# Patient Record
Sex: Female | Born: 1980 | Race: Asian | Hispanic: No | Marital: Single | Smoking: Former smoker
Health system: Southern US, Community
[De-identification: ages and names within clinical notes are randomized; demographics above are authoritative.]

## PROBLEM LIST (undated history)

## (undated) ENCOUNTER — Encounter

## (undated) HISTORY — PX: MOUTH SURGERY: SHX715

---

## 2016-10-01 ENCOUNTER — Ambulatory Visit: Admitting: Internal Medicine

## 2016-12-20 ENCOUNTER — Ambulatory Visit: Admitting: Adult Health

## 2016-12-23 LAB — HX THROAT CULT GRP A: CASE NUMBER: 2018028000945

## 2017-08-30 ENCOUNTER — Emergency Department (HOSPITAL_COMMUNITY)
Admission: EM | Admit: 2017-08-30 | Discharge: 2017-08-30 | Disposition: A | Discharge status: Home / Self Care | Payer: Self-pay | Attending: Emergency Medicine | Admitting: Emergency Medicine

## 2017-08-30 ENCOUNTER — Encounter (HOSPITAL_COMMUNITY): Payer: Self-pay | Admitting: Emergency Medicine

## 2017-08-30 DIAGNOSIS — Z87891 Personal history of nicotine dependence: Secondary | ICD-10-CM | POA: Insufficient documentation

## 2017-08-30 DIAGNOSIS — R110 Nausea: Secondary | ICD-10-CM | POA: Insufficient documentation

## 2017-08-30 DIAGNOSIS — Z79899 Other long term (current) drug therapy: Secondary | ICD-10-CM | POA: Insufficient documentation

## 2017-08-30 DIAGNOSIS — G43009 Migraine without aura, not intractable, without status migrainosus: Secondary | ICD-10-CM | POA: Insufficient documentation

## 2017-08-30 DIAGNOSIS — R42 Dizziness and giddiness: Secondary | ICD-10-CM | POA: Insufficient documentation

## 2017-08-30 LAB — CBC
HEMATOCRIT: 42.3 % (ref 36.0–46.0)
HEMOGLOBIN: 14 g/dL (ref 12.0–15.0)
MCH: 27.8 pg (ref 26.0–34.0)
MCHC: 33.1 g/dL (ref 30.0–36.0)
MCV: 83.9 fL (ref 78.0–100.0)
Platelets: 320 10*3/uL (ref 150–400)
RBC: 5.04 MIL/uL (ref 3.87–5.11)
RDW: 12.9 % (ref 11.5–15.5)
WBC: 9.3 10*3/uL (ref 4.0–10.5)

## 2017-08-30 LAB — I-STAT TROPONIN, ED: TROPONIN I, POC: 0.01 ng/mL (ref 0.00–0.08)

## 2017-08-30 LAB — BASIC METABOLIC PANEL
ANION GAP: 13 (ref 5–15)
BUN: 12 mg/dL (ref 6–20)
CALCIUM: 9.1 mg/dL (ref 8.9–10.3)
CO2: 21 mmol/L — ABNORMAL LOW (ref 22–32)
Chloride: 102 mmol/L (ref 101–111)
Creatinine, Ser: 0.74 mg/dL (ref 0.44–1.00)
GFR calc non Af Amer: >60 mL/min (ref >60)
Glucose, Bld: 104 mg/dL — ABNORMAL HIGH (ref 65–99)
POTASSIUM: 3.3 mmol/L — AB (ref 3.5–5.1)
SODIUM: 136 mmol/L (ref 135–145)

## 2017-08-30 LAB — I-STAT BETA HCG BLOOD, ED (MC, WL, AP ONLY): I-stat hCG, quantitative: <5 m[IU]/mL (ref <5)

## 2017-08-30 LAB — CBG MONITORING, ED: GLUCOSE-CAPILLARY: 116 mg/dL — AB (ref 65–99)

## 2017-08-30 MED ORDER — KETOROLAC TROMETHAMINE 30 MG/ML IJ SOLN
30.0000 mg | Freq: Once | INTRAMUSCULAR | Status: AC
  Administered 2017-08-30: 30 mg via INTRAVENOUS
  Filled 2017-08-30: qty 1

## 2017-08-30 MED ORDER — METOCLOPRAMIDE HCL 5 MG/ML IJ SOLN
10.0000 mg | Freq: Once | INTRAMUSCULAR | Status: AC
  Administered 2017-08-30: 10 mg via INTRAVENOUS
  Filled 2017-08-30: qty 2

## 2017-08-30 MED ORDER — IBUPROFEN 200 MG PO TABS
ORAL_TABLET | ORAL | Status: AC
  Filled 2017-08-30: qty 3

## 2017-08-30 MED ORDER — SODIUM CHLORIDE 0.9 % IV BOLUS (SEPSIS)
1000.0000 mL | Freq: Once | INTRAVENOUS | Status: AC
  Administered 2017-08-30: 1000 mL via INTRAVENOUS

## 2017-08-30 MED ORDER — METOCLOPRAMIDE HCL 10 MG PO TABS: 10.0000 mg | ORAL_TABLET | Freq: Four times a day (QID) | ORAL | 0 refills | Status: AC

## 2017-08-30 MED ORDER — DEXAMETHASONE SODIUM PHOSPHATE 10 MG/ML IJ SOLN
10.0000 mg | Freq: Once | INTRAMUSCULAR | Status: AC
  Administered 2017-08-30: 10 mg via INTRAVENOUS
  Filled 2017-08-30: qty 1

## 2017-08-30 MED ORDER — IBUPROFEN 800 MG PO TABS: 800.0000 mg | ORAL_TABLET | Freq: Three times a day (TID) | ORAL | 0 refills | Status: AC | PRN

## 2017-08-30 NOTE — Discharge Instructions (Signed)

## 2017-08-30 NOTE — ED Notes (Signed)
Pt verbalized understanding of d/c instructions and has no further questions. Pt is stable, A&Ox4, VSS.  

## 2017-08-30 NOTE — ED Notes (Signed)
Pt to NF requesting update, informed how many ppl ahead of her. Pt states "her heart is racing" Informed pt that we would recheck her VS.

## 2017-08-30 NOTE — ED Triage Notes (Signed)
Pt arrives via POv from home with HA, nausea, chills, dizziness. Pt attributes to stress. Pt alert oriented. Points to face/sinuses as area of pressure/pain. Denies cough, some runny nose. Pt ambulatory, VSS, NAD at present.

## 2017-08-30 NOTE — ED Provider Notes (Signed)
Emergency Department Provider Note   I have reviewed the triage vital signs and the nursing notes.   HISTORY  Chief Complaint Headache   HPI Kimberly Dickerson is a 36 y.o. female with PMH of migraine HA presents to the emergency department for evaluation of continued headache, nausea, chills, lightheadedness. Patient has had some associated face pain/pressure which she attributed to sinus infection. She has a history of both sinus infection and migraine headache. She states that her headache is gradually worsening and located across her forehead. She denies any fevers or neck pain/stiffness. No head trauma. No vision changes. No unilateral weakness or numbness. Today while showering she felt very lightheaded which in conjunction with the headache prompted her ED visit. No vaginal bleeding or discharge. No unilateral leg swelling or tenderness. She reports occasional heart palpitations but denies chest pain or difficulty breathing.    History reviewed. No pertinent past medical history.  There are no active problems to display for this patient.   History reviewed. No pertinent surgical history.  Current Outpatient Rx  . Order #: [id number] Class: Historical Med  . Order #: [id number] Class: Print  . Order #: [id number] Class: Print    Allergies Penicillins  History reviewed. No pertinent family history.  Social History Social History  Substance Use Topics  . Smoking status: Former Smoker    Types: Cigarettes    Quit date: 11/23/2006  . Smokeless tobacco: Not on file  . Alcohol use Yes     Comment: 4shots/days    Review of Systems  Constitutional: No fever/chills. Positive lightheadedness.  Eyes: No visual changes. ENT: No sore throat. Cardiovascular: Denies chest pain. Occasional palpitations.  Respiratory: Denies shortness of breath. Gastrointestinal: No abdominal pain.  No nausea, no vomiting.  No diarrhea.  No constipation. Genitourinary: Negative for  dysuria. Musculoskeletal: Negative for back pain. Skin: Negative for rash. Neurological: Negative for focal weakness or numbness. Positive HA and face pain.   10-point ROS otherwise negative.  ____________________________________________   PHYSICAL EXAM:  VITAL SIGNS: ED Triage Vitals  Enc Vitals Group     BP 08/30/17 1648 (!) 155/97     Pulse Rate 08/30/17 1648 88     Resp 08/30/17 1648 17     Temp 08/30/17 1648 97.8 F (36.6 C)     Temp Source 08/30/17 1648 Oral     SpO2 08/30/17 1648 97 %     Pain Score 08/30/17 2045 7    Constitutional: Alert and oriented. Well appearing and in no acute distress. Mild tenderness to palpation over the maxillary sinuses.  Eyes: Conjunctivae are normal. PERRL. EOMI.  Head: Atraumatic. Nose: No congestion/rhinnorhea. Mouth/Throat: Mucous membranes are moist. Neck: No stridor.   Cardiovascular: Normal rate, regular rhythm. Good peripheral circulation. Grossly normal heart sounds.   Respiratory: Normal respiratory effort.  No retractions. Lungs CTAB. Gastrointestinal: Soft and nontender. No distention.  Musculoskeletal: No lower extremity tenderness nor edema. No gross deformities of extremities. Neurologic:  Normal speech and language. No gross focal neurologic deficits are appreciated.  Skin:  Skin is warm, dry and intact. No rash noted.  ____________________________________________   LABS (all labs ordered are listed, but only abnormal results are displayed)  Labs Reviewed  BASIC METABOLIC PANEL - Abnormal; Notable for the following:       Result Value   Potassium 3.3 (*)    CO2 21 (*)    Glucose, Bld 104 (*)    All other components within normal limits  CBG MONITORING, ED - Abnormal; Notable for  the following:    Glucose-Capillary 116 (*)    All other components within normal limits  CBC  I-STAT BETA HCG BLOOD, ED ([facility], [facility], [facility] ONLY)  I-STAT TROPONIN, ED   ____________________________________________  EKG   EKG  Interpretation  Date/Time:  Monday August 30 2017 15:18:07 EDT Ventricular Rate:  101 PR Interval:  152 QRS Duration: 72 QT Interval:  332 QTC Calculation: 430 R Axis:   55 Text Interpretation:  Sinus tachycardia Abnormal ECG No STEMI.  Confirmed by Alona Bene [phone]) on 08/30/2017 9:22:44 PM       ____________________________________________  RADIOLOGY  None  ____________________________________________   PROCEDURES  Procedure(s) performed:   Procedures  None ____________________________________________   INITIAL IMPRESSION / ASSESSMENT AND PLAN / ED COURSE  Pertinent labs & imaging results that were available during my care of the patient were reviewed by me and considered in my medical decision making (see chart for details).  Patient presents to the emergency department for evaluation of headache with associated nausea, chills, dizziness. She is well appearing and as a normal neurological exam. She has some mild maxillary sinus tenderness to palpation. No sudden onset maximal intensity symptoms to suggest subarachnoid hemorrhage or other intracranial bleed. No head trauma. No evidence of underlying infection other than possible sinusitis. No CP or Dyspnea to suggest cardiogenic or vascular cause of lightheadeness. Normal cranial nerve exam. No CT imaging at this time. Plan for treatment of symptoms with IV fluids, Decadron, Toradol, Reglan, reassessment.  Patient's HA has resolved. Plan for PCP follow up and also provided contact info for Neurology.  At this time, I do not feel there is any life-threatening condition present. I have reviewed and discussed all results (EKG, imaging, lab, urine as appropriate), exam findings with patient. I have reviewed nursing notes and appropriate previous records.  I feel the patient is safe to be discharged home without further emergent workup. Discussed usual and customary return precautions. Patient and family (if present)  verbalize understanding and are comfortable with this plan.  Patient will follow-up with their primary care provider. If they do not have a primary care provider, information for follow-up has been provided to them. All questions have been answered.  ____________________________________________  FINAL CLINICAL IMPRESSION(S) / ED DIAGNOSES  Final diagnoses:  Migraine without aura and without status migrainosus, not intractable  Lightheadedness     MEDICATIONS GIVEN DURING THIS VISIT:  Medications  ibuprofen (ADVIL,MOTRIN) 200 MG tablet (not administered)  sodium chloride 0.9 % bolus 1,000 mL (0 mLs Intravenous Stopped 08/30/17 2256)  ketorolac (TORADOL) 30 MG/ML injection 30 mg (30 mg Intravenous Given 08/30/17 2136)  metoCLOPramide (REGLAN) injection 10 mg (10 mg Intravenous Given 08/30/17 2136)  dexamethasone (DECADRON) injection 10 mg (10 mg Intravenous Given 08/30/17 2136)     NEW OUTPATIENT MEDICATIONS STARTED DURING THIS VISIT:  Discharge Medication List as of 08/30/2017 10:42 PM    START taking these medications   Details  ibuprofen (ADVIL,MOTRIN) 800 MG tablet Take 1 tablet (800 mg total) by mouth every 8 (eight) hours as needed., Starting Mon 08/30/2017, Print    metoCLOPramide (REGLAN) 10 MG tablet Take 1 tablet (10 mg total) by mouth every 6 (six) hours., Starting Mon 08/30/2017, Print        Note:  This document was prepared using Dragon voice recognition software and may include unintentional dictation errors.  Alona Bene, MD Emergency Medicine    Long, Arlyss Repress, MD 08/30/17 [phone]

## 2017-08-31 ENCOUNTER — Encounter (HOSPITAL_COMMUNITY): Payer: Self-pay

## 2017-08-31 ENCOUNTER — Emergency Department (HOSPITAL_COMMUNITY)
Admission: EM | Admit: 2017-08-31 | Discharge: 2017-08-31 | Disposition: A | Discharge status: Home / Self Care | Payer: Self-pay | Attending: Emergency Medicine | Admitting: Emergency Medicine

## 2017-08-31 ENCOUNTER — Emergency Department (HOSPITAL_COMMUNITY): Payer: Self-pay

## 2017-08-31 DIAGNOSIS — Z87891 Personal history of nicotine dependence: Secondary | ICD-10-CM | POA: Insufficient documentation

## 2017-08-31 DIAGNOSIS — R51 Headache: Secondary | ICD-10-CM | POA: Insufficient documentation

## 2017-08-31 DIAGNOSIS — R519 Headache, unspecified: Secondary | ICD-10-CM

## 2017-08-31 DIAGNOSIS — R030 Elevated blood-pressure reading, without diagnosis of hypertension: Secondary | ICD-10-CM | POA: Insufficient documentation

## 2017-08-31 DIAGNOSIS — Z79899 Other long term (current) drug therapy: Secondary | ICD-10-CM | POA: Insufficient documentation

## 2017-08-31 LAB — I-STAT CHEM 8, ED
BUN: 11 mg/dL (ref 6–20)
CHLORIDE: 102 mmol/L (ref 101–111)
Calcium, Ion: 1.15 mmol/L (ref 1.15–1.40)
Creatinine, Ser: 0.5 mg/dL (ref 0.44–1.00)
Glucose, Bld: 128 mg/dL — ABNORMAL HIGH (ref 65–99)
HEMATOCRIT: 48 % — AB (ref 36.0–46.0)
HEMOGLOBIN: 16.3 g/dL — AB (ref 12.0–15.0)
POTASSIUM: 3.8 mmol/L (ref 3.5–5.1)
Sodium: 138 mmol/L (ref 135–145)
TCO2: 24 mmol/L (ref 22–32)

## 2017-08-31 MED ORDER — IBUPROFEN 400 MG PO TABS
ORAL_TABLET | ORAL | Status: AC
  Administered 2017-08-31: 400 mg
  Filled 2017-08-31: qty 1

## 2017-08-31 MED ORDER — IBUPROFEN 400 MG PO TABS: 400.0000 mg | ORAL_TABLET | Freq: Once | ORAL | Status: DC | PRN

## 2017-08-31 MED ORDER — METOCLOPRAMIDE HCL 5 MG/ML IJ SOLN
10.0000 mg | Freq: Once | INTRAMUSCULAR | Status: AC
  Administered 2017-08-31: 10 mg via INTRAVENOUS
  Filled 2017-08-31: qty 2

## 2017-08-31 MED ORDER — SODIUM CHLORIDE 0.9 % IV BOLUS (SEPSIS)
1000.0000 mL | Freq: Once | INTRAVENOUS | Status: AC
  Administered 2017-08-31: 1000 mL via INTRAVENOUS

## 2017-08-31 MED ORDER — DIPHENHYDRAMINE HCL 50 MG/ML IJ SOLN
25.0000 mg | Freq: Once | INTRAMUSCULAR | Status: AC
  Administered 2017-08-31: 25 mg via INTRAVENOUS
  Filled 2017-08-31: qty 1

## 2017-08-31 NOTE — ED Triage Notes (Signed)
Per Pt, Pt is coming from home with complaints of headache that has continued for one week. Pt was seen here yesterday and given IV MIgraine Cocktail. Pt reports headache is worse today and while she was taking a shower she had bilateral "pins and needles" in her hands and legs. Denies any N/V, but reports some photosensitivity. Neuro exam WNL.

## 2017-08-31 NOTE — ED Provider Notes (Signed)
[facility]-EMERGENCY DEPT Provider Note   CSN: [id number] Arrival date & time: 08/31/17  0830     History   Chief Complaint Chief Complaint  Patient presents with  . Headache    HPI Tonye Tancredi is a 36 y.o. female.  Mariella Blackwelder is a 36 y.o. Female who presents the emergency department complaining of a headache ongoing for the past week. Patient reports gradual onset of headache last week that has gradually worsened and has been persistent. She reports generalized headache as well as some lightheadedness with position change. She reports she has a history of Crohn's disease and has had worsening diarrhea since her headache began. She reports 3 loose stools yesterday and one loose stool today. No hematochezia. She denies any abdominal pain, nausea or vomiting. She was seen in the emergency department yesterday for her headache.She reports the medicine she was given helped her headache for about an hour and a half and then her symptoms have returned. She returned to the emergency department this morning because while in the shower today she felt tingling in her bilateral hands and legs. This has since resolved. She has taken nothing for treatment of her symptoms today. She denies fevers, neck stiffness, changes to her vision, abdominal pain, nausea, vomiting, hematochezia, urinary symptoms, rashes, numbness or weakness.   The history is provided by the patient and medical records. No language interpreter was used.  Headache   Pertinent negatives include no fever, no shortness of breath, no nausea and no vomiting.    History reviewed. No pertinent past medical history.  There are no active problems to display for this patient.   Past Surgical History:  Procedure Laterality Date  . MOUTH SURGERY      OB History    No data available       Home Medications    Prior to Admission medications   Medication Sig Start Date End Date Taking? Authorizing Provider  acetaminophen (TYLENOL)  325 MG tablet Take 650 mg by mouth every 6 (six) hours as needed for mild pain.   Yes [provider]  ibuprofen (ADVIL,MOTRIN) 800 MG tablet Take 1 tablet (800 mg total) by mouth every 8 (eight) hours as needed. Patient taking differently: Take 800 mg by mouth every 8 (eight) hours as needed for moderate pain.  08/30/17  Yes Long, Arlyss Repress, MD  norethindrone-ethinyl estradiol 1/35 (ORTHO-NOVUM, NORTREL,CYCLAFEM) tablet Take 1 tablet by mouth daily.   Yes [provider]  metoCLOPramide (REGLAN) 10 MG tablet Take 1 tablet (10 mg total) by mouth every 6 (six) hours. Patient not taking: Reported on 08/31/2017 08/30/17   Long, Arlyss Repress, MD    Family History No family history on file.  Social History Social History  Substance Use Topics  . Smoking status: Former Smoker    Types: Cigarettes    Quit date: 11/23/2006  . Smokeless tobacco: Not on file  . Alcohol use Yes     Comment: 4shots/days     Allergies   Penicillins   Review of Systems Review of Systems  Constitutional: Negative for chills and fever.  HENT: Negative for congestion and sore throat.   Eyes: Positive for photophobia. Negative for pain and visual disturbance.  Respiratory: Negative for cough and shortness of breath.   Cardiovascular: Negative for chest pain.  Gastrointestinal: Positive for diarrhea. Negative for abdominal pain, blood in stool, nausea and vomiting.  Genitourinary: Negative for dysuria.  Musculoskeletal: Negative for back pain and neck stiffness.  Skin: Negative for rash.  Neurological: Positive for light-headedness and headaches. Negative for dizziness, syncope, weakness and numbness.     Physical Exam Updated Vital Signs BP (!) 146/108 (BP Location: Left Arm)   Pulse 89   Temp 98.4 F (36.9 C)   Resp 16   Ht  (1.549 m)   Wt 63.5 kg (140 lb)   LMP 08/24/2017   SpO2 99%   BMI 26.45 kg/m   Physical Exam  Constitutional: She is oriented to person, place, and time. She  appears well-developed and well-nourished. No distress.  Nontoxic appearing.  HENT:  Head: Normocephalic and atraumatic.  Right Ear: External ear normal.  Left Ear: External ear normal.  Mouth/Throat: Oropharynx is clear and moist.  Eyes: Pupils are equal, round, and reactive to light. Conjunctivae and EOM are normal. Right eye exhibits no discharge. Left eye exhibits no discharge.  Neck: Normal range of motion. Neck supple. No JVD present. No tracheal deviation present.  No meningeal signs.  Cardiovascular: Normal rate, regular rhythm, normal heart sounds and intact distal pulses.  Exam reveals no gallop and no friction rub.   No murmur heard. Pulmonary/Chest: Effort normal and breath sounds normal. No stridor. No respiratory distress. She has no wheezes. She has no rales.  Lungs are clear to ascultation bilaterally. Symmetric chest expansion bilaterally. No increased work of breathing. No rales or rhonchi.    Abdominal: Soft. Bowel sounds are normal. She exhibits no distension and no mass. There is no tenderness. There is no guarding.  Abdomen is soft and nontender to palpation.  Musculoskeletal: Normal range of motion. She exhibits no edema, tenderness or deformity.  Patient is spontaneously moving all extremities in a coordinated fashion exhibiting good strength.   Lymphadenopathy:    She has no cervical adenopathy.  Neurological: She is alert and oriented to person, place, and time. No cranial nerve deficit or sensory deficit. She exhibits normal muscle tone. Coordination normal.  Patient is alert and oriented 3. Speech is clear and coherent. Cranial nerves are intact. Sensation and strength is intact to bilateral upper and lower extremities. Normal gait. No pronator drift. Finger to nose intact bilaterally. EOMs are intact. Vision is grossly intact.  Skin: Skin is warm and dry. Capillary refill takes less than 2 seconds. No rash noted. She is not diaphoretic. No erythema. No pallor.    Psychiatric: She has a normal mood and affect. Her behavior is normal.  Nursing note and vitals reviewed.    ED Treatments / Results  Labs (all labs ordered are listed, but only abnormal results are displayed) Labs Reviewed  I-STAT CHEM 8, ED - Abnormal; Notable for the following:       Result Value   Glucose, Bld 128 (*)    Hemoglobin 16.3 (*)    HCT 48.0 (*)    All other components within normal limits    EKG  EKG Interpretation None       Radiology Ct Head Wo Contrast  Result Date: 08/31/2017 CLINICAL DATA:  36 year old female with acute headache for 1 week. Treated with IV migraine cocktail yesterday but progressive symptoms today. Bilateral extremity paresthesias. EXAM: CT HEAD WITHOUT CONTRAST TECHNIQUE: Contiguous axial images were obtained from the base of the skull through the vertex without intravenous contrast. COMPARISON:  None. FINDINGS: Brain: Cerebral volume is within normal limits. No midline shift, ventriculomegaly, mass effect, evidence of mass lesion, intracranial hemorrhage or evidence of cortically based acute infarction. Gray-white matter differentiation is within normal limits throughout the brain. Vascular: No suspicious  intracranial vascular hyperdensity. Skull: Negative.  No acute osseous abnormality identified. Sinuses/Orbits: Clear. Other: Visualized orbit soft tissues are within normal limits. Visualized scalp soft tissues are within normal limits. IMPRESSION: Normal noncontrast CT appearance of the brain. Electronically Signed   By: Odessa Fleming M.D.   On: 08/31/2017 12:25    Procedures Procedures (including critical care time)  Medications Ordered in ED Medications  ibuprofen (ADVIL,MOTRIN) tablet 400 mg (not administered)  ibuprofen (ADVIL,MOTRIN) 400 MG tablet (400 mg  Given 08/31/17 0853)  sodium chloride 0.9 % bolus 1,000 mL (1,000 mLs Intravenous New Bag/Given 08/31/17 1154)  metoCLOPramide (REGLAN) injection 10 mg (10 mg Intravenous Given 08/31/17  1154)  diphenhydrAMINE (BENADRYL) injection 25 mg (25 mg Intravenous Given 08/31/17 1154)     Initial Impression / Assessment and Plan / ED Course  I have reviewed the triage vital signs and the nursing notes.  Pertinent labs & imaging results that were available during my care of the patient were reviewed by me and considered in my medical decision making (see chart for details).    This is a 36 y.o. Female who presents the emergency department complaining of a headache ongoing for the past week. Patient reports gradual onset of headache last week that has gradually worsened and has been persistent. She reports generalized headache as well as some lightheadedness with position change. She reports she has a history of Crohn's disease and has had worsening diarrhea since her headache began. She reports 3 loose stools yesterday and one loose stool today. No hematochezia. She denies any abdominal pain, nausea or vomiting. She was seen in the emergency department yesterday for her headache.She reports the medicine she was given helped her headache for about an hour and a half and then her symptoms have returned. She returned to the emergency department this morning because while in the shower today she felt tingling in her bilateral hands and legs. This has since resolved. She has taken nothing for treatment of her symptoms today. She denies fevers, neck stiffness, changes to her vision, abdominal pain. On exam the patient is afebrile nontoxic appearing.  Presentation is like pts typical HA and non concerning for [facility], [facility], Meningitis, or temporal arteritis. Pt is afebrile with no focal neuro deficits, nuchal rigidity, or change in vision. Pt HA treated and resolved while in ED. She reports feeling much better at revaluation and no headache. CT scan is normal. She reports lots of stress recently. I discussed methods to help with her stress. I encouraged close follow up by PCP for recheck blood pressure. Pt is  to follow up with PCP to discuss prophylactic medication. Pt verbalizes understanding and is agreeable with plan to dc.  I advised the patient to follow-up with their primary care provider this week. I advised the patient to return to the emergency department with new or worsening symptoms or new concerns. The patient verbalized understanding and agreement with plan.    Final Clinical Impressions(s) / ED Diagnoses   Final diagnoses:  Bad headache  Elevated blood pressure reading    New Prescriptions New Prescriptions   No medications on file     Everlene Farrier, Cordelia Poche 08/31/17 1341    Tilden Fossa, MD 09/01/17 [phone]

## 2017-08-31 NOTE — ED Notes (Signed)
Got patient into a gown on the monitor patient is resting with call bell in reach 

## 2017-08-31 NOTE — ED Notes (Signed)
PA at bedside.

## 2017-08-31 NOTE — ED Notes (Signed)
Transported to ct scan 

## 2018-06-08 ENCOUNTER — Ambulatory Visit: Admitting: Cardiovascular Disease

## 2018-06-08 ENCOUNTER — Ambulatory Visit

## 2018-06-08 ENCOUNTER — Ambulatory Visit: Admitting: Emergency Medicine

## 2018-06-08 LAB — HX COMPREHENSIVE METABOLIC PANEL
CASE NUMBER: 2019198000778
HX ALBUMIN LVL: 3.2 g/dL (ref 3.2–5.0)
HX ALKALINE PHOSPHATASE: 46 U/L (ref 30.0–117.0)
HX ALT: 45 U/L (ref 6.0–55.0)
HX ANION GAP: 10 (ref 3.0–11.0)
HX AST: 52 U/L — ABNORMAL HIGH (ref 6.0–40.0)
HX BILIRUBIN TOTAL: 0.6 mg/dL (ref 0.2–1.2)
HX BUN: 11 mg/dL (ref 6.0–20.0)
HX CALCIUM LVL: 8.4 mg/dL — ABNORMAL LOW (ref 8.5–10.5)
HX CHLORIDE: 106 mmol/L (ref 98.0–110.0)
HX CO2: 23 mmol/L (ref 21.0–32.0)
HX CREATININE: 0.681 mg/dL (ref 0.55–1.3)
HX GLUCOSE LVL: 108 mg/dL (ref 70.0–110.0)
HX POTASSIUM LVL: 3.4 mmol/L — ABNORMAL LOW (ref 3.6–5.2)
HX SODIUM LVL: 139 mmol/L (ref 136.0–146.0)
HX TOTAL PROTEIN: 7.8 g/dL (ref 6.0–8.4)

## 2018-06-08 LAB — HX .AUTOMATED DIFF
CASE NUMBER: 2019198000778
HX ABSOLUTE BASO COUNT: 0.11 10*3/uL (ref 0.0–0.22)
HX ABSOLUTE EOS COUNT: 0.26 10*3/uL (ref 0.0–0.45)
HX ABSOLUTE LYMPHS COUNT: 2.62 10*3/uL (ref 0.74–5.04)
HX ABSOLUTE MONO COUNT: 0.61 10*3/uL (ref 0.0–1.34)
HX ABSOLUTE NEUTRO COUNT: 4.29 10*3/uL (ref 1.48–7.95)
HX BASOPHILS: 1.4 %
HX EOSINOPHILS: 3.3 %
HX IMMATURE GRANULOCYTES: 0.1 % (ref 0.0–2.0)
HX LYMPHOCYTES: 33.2 %
HX MONOCYTES: 7.7 %
HX NEUTROPHILS: 54.3 %

## 2018-06-08 LAB — HX TSH
CASE NUMBER: 18239982
HX 3RD GEN TSH: 1.45 u[IU]/mL (ref 0.358–3.74)

## 2018-06-08 LAB — HX DRUGS OF ABUSE URINE 5
CASE NUMBER: 2019198000779
HX U AMPHETAMINES SCRN: NEGATIVE
HX U BENZODIAZEPINE SCRN: NEGATIVE
HX U COCAINE SCRN: NEGATIVE
HX U ETHANOL INTERP: NEGATIVE
HX U ETHANOL: 42 mg/dL (ref 0.0–49.0)
HX U OPIATE 300 SCRN: NEGATIVE
HX U PH FOR DAU: 6 (ref 4.5–8.0)

## 2018-06-08 LAB — HX GLOMERULAR FILTRATION RATE (ESTIMATED)
CASE NUMBER: 2019198000778
HX AFN AMER GLOMERULAR FILTRATION RATE: >90
HX NON-AFN AMER GLOMERULAR FILTRATION RATE: >90

## 2018-06-08 LAB — HX CBC W/ DIFF
CASE NUMBER: 2019198000778
HX ABSOLUTE NRBC COUNT: 0 10*3/uL
HX HCT: 43.7 % (ref 36.0–47.0)
HX HGB: 14.2 g/dL (ref 11.8–16.0)
HX MCH: 27.3 pg (ref 26.0–34.0)
HX MCHC: 32.5 g/dL (ref 31.0–37.0)
HX MCV: 83.9 fL (ref 80.0–100.0)
HX MPV: 9.4 fL (ref 9.4–12.4)
HX NRBC PERCENT: 0 %
HX PLATELET: 301 10*3/uL (ref 150.0–400.0)
HX RBC: 5.21 10*6/uL — ABNORMAL HIGH (ref 3.9–5.2)
HX RDW-CV: 13.6 % (ref 11.5–14.5)
HX RDW-SD: 41.9 fL (ref 35.0–51.0)
HX WBC: 7.9 10*3/uL (ref 3.7–11.2)

## 2018-06-08 LAB — HX URINE DIPSTICK W/REFLEX
CASE NUMBER: 2019198000779
HX UA BILIRUBIN: NEGATIVE
HX UA GLUCOSE: NEGATIVE
HX UA KETONES: 5 mg/dL — AB
HX UA LEUKOCYTE ESTERASE: NEGATIVE
HX UA NITRITE: NEGATIVE
HX UA PH: 7 (ref 5.0–8.0)
HX UA PROTEIN: NEGATIVE
HX UA RBC: 1 /HPF (ref 0.0–2.0)
HX UA SPECIFIC GRAVITY: 1.004 (ref 1.003–1.03)
HX UA SQUAMOUS EPITHELIAL: <1 (ref 0.0–5.0)
HX UA UROBILINOGEN: NEGATIVE
HX UA WBC: <1 (ref 0.0–5.0)

## 2018-06-08 LAB — HX MAGNESIUM LEVEL
CASE NUMBER: 2019198000778
HX MAGNESIUM LVL: 1.9 mg/dL (ref 1.7–2.5)

## 2018-06-08 LAB — HX ETHANOL LEVEL
CASE NUMBER: 2019198000778
HX ETHANOL LVL: 43 mg/dL — ABNORMAL HIGH (ref 0.0–3.0)

## 2018-06-08 LAB — HX D-DIMER QUANTITATIVE
CASE NUMBER: 18239982
HX D-DIMER QUANT: 0.25 mg{FEU}/L

## 2018-06-08 LAB — HX TROPONIN I
CASE NUMBER: 2019198000778
HX TROPONIN I: <0.015 (ref 0.015–0.045)

## 2018-06-08 LAB — HX PT
CASE NUMBER: 2019198000778
HX INR: 1
HX PT: 10.6 s (ref 9.3–11.6)

## 2018-06-08 NOTE — H&P (Signed)
Chief Complaint    Palpitations    History of Present Illness    37 year old female with history of        Inflammatory bowel disease presumed to be Crohn's presently not on any  medications        Active alcohol abuse        Presented to the emergency department with complaints of palpitations.  Patient was apparently asymptomatic until today morning then all of a sudden  she experienced palpitations which she describes like a pounding sensation in  her chest associated with mild dizziness.  Patient denied any associated chest  pain, shortness of breath or syncopal event. Patient's husband checked her  pulse rate and found it to be irregular and fast so he drove her to the  emergency department for further evaluation.  Patient on arrival to the ER was  noted to be in rapid fibrillation for which she was started on diltiazem drip.  Patient denied any similar episodes in the past.  Patient admits alcohol  intake on a regular basis.      Review of Systems    Constitutional: No Fever, Chills, Sweats, Weakness, Fatigue, Decreased  Activity    Eye: No Recent visual problem, Icterus, Discharge, Blurring, Double vision,  Visual disturbances    Respiratory: No SOB, Cough, Sputum Production, Hemoptysis, Wheezing, Cyanosis,  Apnea    Cardiovascular: No Chest pain. Positive for  palpitations,dizziness    Gastrointestinal: No nausea, vomiting, diarrhea, constipation, heartburn,  abdominal pain, hematemesis    Genitourinary: No Dysuria, hematuria, change in urine stream, urethral  discharge, lesions    Hematology/Lymph: No Bruising tendency, bleeding tendency, swollen lymph  glands    Endocrine: No excessive thirst, polyuria, cold intolerance, heat intolerance,  excessive hunger    Immunologic: No Immunocompromise, recurrent fevers, recurrent infections,  malaise    Musculoskeletal: No Back pain, neck pain, joint pain, muscle pain, decreased  ROM, trauma    Integumentary: No Rash, pruritis, abrasions, breakdown, burns,  dryness,  petechiae, skin lesion, hypertrophic scar, keloid    Neurologic: No abnormal balance, confusion, numbness, tingling, headache    Psychiatric: No anxiety, mania, delusional, hallucinations    All other ROS:          Code Status    Full Code    Physical Exam    Vitals & Measurements    **T: **97.9 F  (Oral) **HR: **74 **RR: **23 **BP: **102/73 **SpO2: **98% **O2  Method (L/min): **Room air **WT: **70 Kg    General: Alert and oriented, no acute distress    Eye: PERRL, Normal Conjunctiva    HEENT: Normocephalic, no damage to dentition, moist oral mucosa, no pharyngeal  erythema, no sinus tenderness    Neck: supple, nontender, carotid pulse WNL, no carotid bruit, no JVD, no  lymphadenopathy, no thyromegaly, full ROM    Respiratory: Lungs clear to auscultation, respirations non-labored, breath  sounds equal and regular, symmetrical chest expansion, no chest wall  tenderness    Cardiovascular: Normal Rate, normal rhythm, _ beats/minute, no gallop, good  pulses equal in all extremities, normal peripheral perfusion, no edema.    Gastrointestinal: Abdomen soft, non tender, non-distended, normal bowel sounds  all four quadrants, no organomegaly    Neurologic: Alert, Oriented, normal sensory, normal motor, no focal deficits.  CN II-XII intact.      Impression and Plan    37 year old female with history of        Inflammatory bowel disease presumed to be Crohn's presently not on  any  medications        Active alcohol abuse        Presented to the emergency department with complaints of palpitations.  Patient was apparently asymptomatic until today morning then all of a sudden  she experienced palpitations which she describes like a pounding sensation in  her chest associated with mild dizziness.  Patient denied any associated chest  pain, shortness of breath or syncopal event. Patient's husband checked her  pulse rate and found it to be irregular and fast so he drove her to the  emergency department for further  evaluation.  Patient on arrival to the ER was  noted to be in rapid fibrillation for which she was started on diltiazem drip.  Patient denied any similar episodes in the past.  Patient admits alcohol  intake on a regular basis.        Rapid atrial fibrillation: Patient was placed on diltiazem drip to control her  heart rate.  Patient will also be started on beta-blockers and anticoagulation  with enoxaparin.  Echo is pending.  Patient will also be evaluated by  cardiology team and may need TEE cardioversion if she does not convert  spontaneously to sinus rhythm.        Alcohol abuse: Patient stated that she drinks 7 to 10 shots of tequila every  day which is also a trigger for atrial fibrillation. Patient was counseled in  detail to quit alcohol. Patient will be placed on phenobarbital protocol.        DVT prophylaxis: Patient is on  subcutaneous enoxaparin.        CODE STATUS patient is listed as a full code          CMS Two-Midnight Rule    I certify that hospital inpatient services are reasonable and medically  necessary. They are appropriately provided as inpatient services in accordance  with the two midnight benchmark under 42CFR 412 3(e), or the services are  specified as inpatient only procedure under 42 CFR 419 22(n).          Problem List/Past Medical History    Ongoing    Alcohol abuse    IBS - Irritable bowel syndrome    Irritable bowel syndrome    Migraines    Historical    No qualifying data    Procedure/Surgical History    Nerve repair procedure in mouth.    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    Colon: Negative: Mother, Father, Sister, Brother, Daughter, Son, Mat.  Emelia Loron, Mat. Grandmother, [place]. Grandfather and Dennie Bible. Grandmother.    Allergies    amoxicillin    penicillins    Medications    _Inpatient_    Dextrose 5% in Water 100 mL + Cardizem IV Additive 125  mg    folic acid, 1 mg= 1 tab(s), PO, Daily    Lovenox, 70 mg= 0.7 mL, 1 mg/Kg, sc, q12hr    metoclopramide, 10 mg= 2 mL, IV Push, q6hr, PRN    Metoprolol Tartrate (Lopressor), 25 mg= 1 tab(s), PO, TID    multivitamin with minerals, 1 tab(s), PO, Daily    ondansetron, 4 mg= 2 mL, IV Push, q8hr, PRN    pantoprazole, 40 mg= 1 tab(s), PO, Daily    PHENobarbital, 64.8 mg= 1 tab(s), PO, q8hr  PHENobarbital, 64.8 mg= 1 tab(s), PO, q12hr    PHENobarbital, 32.4 mg= 1 tab(s), PO, q12hr    PHENobarbital, 64.8 mg= 1 tab(s), PO, q6hr    PHENobarbital, 65 mg= 1 mL, IV Push, q8hr, PRN    thiamine, 100 mg= 1 tab(s), PO, Daily    Tylenol 325 mg oral tablet, 650 mg= 2 tab(s), PO, q4hr, PRN    _Home_    Alyacen 1/35 oral tablet, 1 tab(s), PO, Daily, TAKE 1 TABLET BY MOUTH EVERY  DAY    Diet    Regular Diet - Ordered    -- 06/08/18 11:23:00 EDT, Assist Room Service, Scheduled / PRN    Lab Results    Glucose Lvl: 108 mg/dL (08/65/78 46:96:29)    BUN: 11 mg/dL (52/84/13 24:40:10)    Creatinine: 0.681 mg/dL (27/25/36 64:40:34)    Afn Amer Glomerular Filtration Rate: >90 (06/08/18 09:09:00)    Non-Afn Amer Glomerular Filtration Rate: >90 (06/08/18 09:09:00)    Sodium Lvl: 139 mmol/L (06/08/18 09:09:00)    Potassium Lvl: 3.4 mmol/L Low (06/08/18 09:09:00)    Chloride: 106 mmol/L (06/08/18 09:09:00)    CO2: 23 mmol/L (06/08/18 09:09:00)    Anion Gap: 10 (06/08/18 09:09:00)    Total Protein: 7.8 Gm/dL (74/25/95 63:87:56)    Albumin Lvl: 3.2 Gm/dL (43/32/95 18:84:16)    Calcium Lvl: 8.4 mg/dL Low (60/63/01 60:10:93)    Magnesium Lvl: 1.9 mg/dL (23/55/73 22:02:54)    Bilirubin Total: 0.6 mg/dL (27/06/23 76:28:31)    Alkaline Phosphatase: 46 Units/L (06/08/18 09:09:00)    AST: 52 Units/L High (06/08/18 09:09:00)    ALT: 45 Units/L (06/08/18 09:09:00)    3rd Gen TSH: 1.45 mclU/ml (06/08/18 09:09:00)    Troponin I: <0.015 (06/08/18 09:09:00)    Ethanol Lvl: 43 mg/dL High (51/76/16 07:37:10)    U Amphetamines Scrn: Negative (06/08/18  09:04:00)    U Benzodiazepine Scrn: Negative (06/08/18 09:04:00)    U Cocaine Scrn: Negative (06/08/18 09:04:00)    U Ethanol: 42 mg/dL (62/69/48 54:62:70)    U Ethanol Interp: Negative (06/08/18 09:04:00)    U Opiate 300 Scrn: Negative (06/08/18 09:04:00)    Confirm Positive: Confirm Upon Request (06/08/18 09:04:00)    WBC: 7.9 thous/mm3 (06/08/18 09:09:00)    RBC: 5.21 Mil/mm3 High (06/08/18 09:09:00)    Hgb: 14.2 Gm/dL (35/00/93 81:82:99)    Hct: 43.7 % (06/08/18 09:09:00)    Platelet: 301 thous/mm3 (06/08/18 09:09:00)    MCV: 83.9 fL (06/08/18 09:09:00)    MCH: 27.3 pGm (06/08/18 09:09:00)    MCHC: 32.5 Gm/dL (37/16/96 78:93:81)    RDW-SD: 41.9 fL (06/08/18 09:09:00)    MPV: 9.4 fL (06/08/18 09:09:00)    Absolute Neutro Count: 4.29 thous/mm3 (06/08/18 09:09:00)    Absolute Lymphs Count: 2.62 thous/mm3 (06/08/18 09:09:00)    Absolute Mono Count: 0.61 thous/mm3 (06/08/18 09:09:00)    Absolute Eos Count: 0.26 thous/mm3 (06/08/18 09:09:00)    Absolute Baso Count: 0.11 thous/mm3 (06/08/18 09:09:00)    Neutrophils: 54.3 % (06/08/18 09:09:00)    Lymphocytes: 33.2 % (06/08/18 09:09:00)    Monocytes: 7.7 % (06/08/18 09:09:00)    Eosinophils: 3.3 % (06/08/18 09:09:00)    Basophils: 1.4 % (06/08/18 09:09:00)    Immature Granulocytes: 0.1 % (06/08/18 09:09:00)    NRBC Percent: 0 % (06/08/18 09:09:00)    Absolute NRBC Count: 0 thous/mm3 (06/08/18 09:09:00)    UA Color: Straw1 (06/08/18 09:04:00)    UA Clarity: Clear1 (06/08/18 09:04:00)    UA Specific Gravity: 1.004 (06/08/18 09:04:00)    UA  pH: 7 (06/08/18 09:04:00)    UA Protein: Negative1 (06/08/18 09:04:00)    UA Glucose: Negative1 (06/08/18 09:04:00)    UA Ketones: 5 Abnormal (06/08/18 09:04:00)    UA Bilirubin: Negative1 (06/08/18 09:04:00)    UA Blood: Moderate Abnormal (06/08/18 09:04:00)    UA Urobilinogen: Negative1 (06/08/18 09:04:00)    UA Nitrite: Negative1 (06/08/18 09:04:00)    UA Leukocyte Esterase: Negative1 (06/08/18 09:04:00)    UA RBC: 1 /HPF (06/08/18  09:04:00)    UA WBC: <1 (06/08/18 09:04:00)    UA Squamous Epithelial: <1 (06/08/18 09:04:00)    UA Bacteria: None1 (06/08/18 09:04:00)    UA Mucous: Few (06/08/18 09:04:00)    PT: 10.6 sec (06/08/18 09:09:00)    INR: 1 (06/08/18 09:09:00)    D-Dimer Quant: 0.25 mg/L FEU (06/08/18 09:09:00)    Diagnostic Results        ------        SIGNATURE LINE Electronically signed by Zane Herald MD-HOSP, Shantia Sanford on 06/08/2018 at  17:14:56 EST

## 2018-06-08 NOTE — Discharge Summary (Signed)
Date of Admission    06/08/2018    Date of Discharge    06/09/2018    Admission History    37 year old female with history of        Inflammatory bowel disease presumed to be Crohn's presently not on any  medications        Active alcohol abuse        Presented to the emergency department with complaints of palpitations.  Patient was apparently asymptomatic until today morning then all of a sudden  she experienced palpitations which she describes like a pounding sensation in  her chest associated with mild dizziness.  Patient denied any associated chest  pain, shortness of breath or syncopal event. Patient's husband checked her  pulse rate and found it to be irregular and fast so he drove her to the  emergency department for further evaluation.  Patient on arrival to the ER was  noted to be in rapid fibrillation for which she was started on diltiazem drip.  Patient denied any similar episodes in the past.  Patient admits alcohol  intake on a regular basis. [1]    Code Status    Code Status - Ordered    -- 06/08/18 16:56:00 EDT, Full Resuscitation, Constant Order    Allergies    amoxicillin    penicillins    Social History    _Alcohol_    Current, Liquor, Daily    _Substance Abuse_ \- Denies Substance Abuse    _Tobacco_ \- Denies Tobacco Use    Hospital Course    Procedures and Treatment Provided    Physical Exam    Vitals & Measurements    **T: **97.8 F  (Oral) **TMIN: **97.5 F  (Oral) **TMAX: **98.1 F  (Oral)  **HR: **60 **RR: **14 **BP: **103/72 **SpO2: **98% **O2 Method (L/min): **Room  air **WT: **78 Kg    General: Alert and oriented, no acute distress    Eye: PERRL, Normal Conjunctiva    HEENT: Normocephalic, no damage to dentition, moist oral mucosa, no pharyngeal  erythema, no sinus tenderness    Neck: supple, nontender, carotid pulse WNL, no carotid bruit, no JVD, no  lymphadenopathy, no thyromegaly, full ROM    Respiratory: Lungs clear to auscultation, respirations non-labored, breath  sounds  equal and regular, symmetrical chest expansion, no chest wall  tenderness    Cardiovascular: Normal Rate, normal rhythm, _ beats/minute, no gallop, good  pulses equal in all extremities, normal peripheral perfusion, no edema.    Gastrointestinal: Abdomen soft, non tender, non-distended, normal bowel sounds  all four quadrants, no organomegaly    Neurologic: Alert, Oriented, normal sensory, normal motor, no focal deficits.  CN II-XII intact.    [2]    Lab Results    Glucose Lvl: 108 mg/dL (32/44/01 02:72:53)    BUN: 11 mg/dL (66/44/03 47:42:59)    Creatinine: 0.681 mg/dL (56/38/75 64:33:29)    Afn Amer Glomerular Filtration Rate: >90 (06/08/18 09:09:00)    Non-Afn Amer Glomerular Filtration Rate: >90 (06/08/18 09:09:00)    Sodium Lvl: 139 mmol/L (06/08/18 09:09:00)    Potassium Lvl: 3.4 mmol/L Low (06/08/18 09:09:00)    Chloride: 106 mmol/L (06/08/18 09:09:00)    CO2: 23 mmol/L (06/08/18 09:09:00)    Anion Gap: 10 (06/08/18 09:09:00)    Total Protein: 7.8 Gm/dL (51/88/41 66:06:30)    Albumin Lvl: 3.2 Gm/dL (16/01/09 32:35:57)    Calcium Lvl: 8.4 mg/dL Low (32/20/25 42:70:62)    Magnesium Lvl: 1.9 mg/dL (37/62/83 15:17:61)    Bilirubin Total:  0.6 mg/dL (16/10/96 04:54:09)    Alkaline Phosphatase: 46 Units/L (06/08/18 09:09:00)    AST: 52 Units/L High (06/08/18 09:09:00)    ALT: 45 Units/L (06/08/18 09:09:00)    hCG Qualitative: Negative (06/09/18 02:27:00)    3rd Gen TSH: 1.45 mclU/ml (06/08/18 09:09:00)    Troponin I: <0.015 (06/08/18 09:09:00)    Ethanol Lvl: 43 mg/dL High (81/19/14 78:29:56)    U Amphetamines Scrn: Negative (06/08/18 09:04:00)    U Benzodiazepine Scrn: Negative (06/08/18 09:04:00)    U Cocaine Scrn: Negative (06/08/18 09:04:00)    U Ethanol: 42 mg/dL (21/30/86 57:84:69)    U Ethanol Interp: Negative (06/08/18 09:04:00)    U Opiate 300 Scrn: Negative (06/08/18 09:04:00)    Confirm Positive: Confirm Upon Request (06/08/18 09:04:00)    WBC: 7.9 thous/mm3 (06/08/18 09:09:00)    RBC: 5.21 Mil/mm3 High  (06/08/18 09:09:00)    Hgb: 14.2 Gm/dL (62/95/28 41:32:44)    Hct: 43.7 % (06/08/18 09:09:00)    Platelet: 301 thous/mm3 (06/08/18 09:09:00)    MCV: 83.9 fL (06/08/18 09:09:00)    MCH: 27.3 pGm (06/08/18 09:09:00)    MCHC: 32.5 Gm/dL (11/24/70 53:66:44)    RDW-SD: 41.9 fL (06/08/18 09:09:00)    MPV: 9.4 fL (06/08/18 09:09:00)    Absolute Neutro Count: 4.29 thous/mm3 (06/08/18 09:09:00)    Absolute Lymphs Count: 2.62 thous/mm3 (06/08/18 09:09:00)    Absolute Mono Count: 0.61 thous/mm3 (06/08/18 09:09:00)    Absolute Eos Count: 0.26 thous/mm3 (06/08/18 09:09:00)    Absolute Baso Count: 0.11 thous/mm3 (06/08/18 09:09:00)    Neutrophils: 54.3 % (06/08/18 09:09:00)    Lymphocytes: 33.2 % (06/08/18 09:09:00)    Monocytes: 7.7 % (06/08/18 09:09:00)    Eosinophils: 3.3 % (06/08/18 09:09:00)    Basophils: 1.4 % (06/08/18 09:09:00)    Immature Granulocytes: 0.1 % (06/08/18 09:09:00)    NRBC Percent: 0 % (06/08/18 09:09:00)    Absolute NRBC Count: 0 thous/mm3 (06/08/18 09:09:00)    UA Color: Straw1 (06/08/18 09:04:00)    UA Clarity: Clear1 (06/08/18 09:04:00)    UA Specific Gravity: 1.004 (06/08/18 09:04:00)    UA pH: 7 (06/08/18 09:04:00)    UA Protein: Negative1 (06/08/18 09:04:00)    UA Glucose: Negative1 (06/08/18 09:04:00)    UA Ketones: 5 Abnormal (06/08/18 09:04:00)    UA Bilirubin: Negative1 (06/08/18 09:04:00)    UA Blood: Moderate Abnormal (06/08/18 09:04:00)    UA Urobilinogen: Negative1 (06/08/18 09:04:00)    UA Nitrite: Negative1 (06/08/18 09:04:00)    UA Leukocyte Esterase: Negative1 (06/08/18 09:04:00)    UA RBC: 1 /HPF (06/08/18 09:04:00)    UA WBC: <1 (06/08/18 09:04:00)    UA Squamous Epithelial: <1 (06/08/18 09:04:00)    UA Bacteria: None1 (06/08/18 09:04:00)    UA Mucous: Few (06/08/18 09:04:00)    PT: 10.6 sec (06/08/18 09:09:00)    INR: 1 (06/08/18 09:09:00)    D-Dimer Quant: 0.25 mg/L FEU (06/08/18 09:09:00)    Discharge Diagnoses    1. Atrial fibrillation     Secondary to alcohol related atrial  fibrillation patient has been heavily  counseled to abstain from alcohol    And she converted to sinus rhythm at this point will continue with beta  blocker Toprol-XL 50 mg by mouth daily    And baby aspirin 81 mg by mouth daily with instruction to stop drinking  alcohol.    2. Alcohol abuse     She has been counseled and advised her to go to inpatient alcohol  rehabilitation program patient declined  However agreed to stay off the alcohol.    Discharge Medications    _Discharge_    folic acid 1 mg oral tablet, 1 mg= 1 tab(s), PO, Daily    Multiple Vitamins with Minerals oral tablet, 1 tab(s), PO, Daily    thiamine 100 mg oral tablet, 100 mg= 1 tab(s), PO, Daily    Toprol-XL 50 mg oral tablet, extended release, 50 mg= 1 tab(s), PO, Daily    Ecasa 81mg  po daily    Counseling    Stop drinking alcohol    [1] Admission H & Norvel Richards MD-HOSP, Anil 06/08/2018 15:39 EDT    [2] Admission H & Norvel Richards MD-HOSP, Willeen Cass 06/08/2018 15:39 EDT    SIGNATURE LINE Electronically signed by Irving Burton MD-HOSP, Claron Rosencrans on 06/09/2018  at 13:49:52 EST

## 2018-06-08 NOTE — Progress Notes (Signed)
Interim History    Doing well. Converted to SR. Feels well.    Physical Exam    Vitals & Measurements    **T: **97.7 F  (Oral) **HR: **81 **RR: **16 **BP: **126/90 **SpO2: **99%    **HT: **154.9 cm **WT: **78 Kg **BMI: **32.67    General: Alert and oriented, no acute distress    Eye: Normal Conjunctiva    HEENT: Normocephalic, moist oral mucosa    Neck: supple, nontender, no JVD    Respiratory: Lungs clear to auscultation    Cardiovascular: Normal Rate, normal rhythm    Gastrointestinal: Abdomen soft, non tender, non-distended, normal bowel sounds  all four quadrants, no organomegaly    Musculoskeletal: normal ROM, normal strength, no tenderness,    Neurologic: Alert, Oriented, normal sensory, normal motor, no focal deficits.        CARDIAC TESTING:        EKG: SR        Tele: SR        ECHO: Normal LV systolic function      Diagnostic Results    Impression and Plan        Medications    _Inpatient_    Dextrose 5% in Water 100 mL + Cardizem IV Additive 125 mg    folic acid, 1 mg= 1 tab(s), PO, Daily    Lovenox, 70 mg= 0.7 mL, 1 mg/Kg, sc, q12hr    metoclopramide, 10 mg= 2 mL, IV Push, q6hr, PRN    multivitamin with minerals, 1 tab(s), PO, Daily    ondansetron, 4 mg= 2 mL, IV Push, q8hr, PRN    pantoprazole, 40 mg= 1 tab(s), PO, Daily    PHENobarbital, 64.8 mg= 1 tab(s), PO, q8hr    PHENobarbital, 64.8 mg= 1 tab(s), PO, q12hr    PHENobarbital, 32.4 mg= 1 tab(s), PO, q12hr    PHENobarbital, 64.8 mg= 1 tab(s), PO, q6hr    PHENobarbital, 65 mg= 1 mL, IV Push, q8hr, PRN    thiamine, 100 mg= 1 tab(s), PO, Daily    Toprol-XL, 50 mg= 2 tab(s), PO, Daily    Tylenol 325 mg oral tablet, 650 mg= 2 tab(s), PO, q4hr, PRN    Lab Results    No qualifying data available.        ------        SIGNATURE LINE Electronically signed by Ramla Hase MD,Timi Reeser J on 06/09/2018 at  13:58:01 EST

## 2018-06-08 NOTE — Consults (Signed)
Reason for Consultation    Palpitations    History of Present Illness    Very pleasant 37 year old woman with no significant medical history except for  alcohol abuse. She reports drinking about 7 shots of tequila on a daily basis  while she makes dinner. She presented to the hospital this morning after  experiencing palpitations. She had never had these symptoms in the past.  They're described as a fast and irregular heart beat. She had no associated  chest pain or pressure or dyspnea. She did have some lightheadedness. There  was no syncope. Symptoms came on when she was in the shower. She was had been  in her usual state of health without any antecedent illnesses. Next line in  the emergency room EKG was performed that revealed atrial fibrillation with  rapid ventricular response. She has been placed on a diltiazem effusion. She  remains in atrial fibrillation. She is aware of her atrial fibrillation. There  is no family history of cardiac issues and specifically no family history of  atrial fibrillation.    TSH has been checked which is within normal limits, d-dimer is negative.      Review of Systems    No chest pain or chest pressure, positive for palpitations but no syncope  although there has been near-syncope, no bleeding bruising or rashes, no  fevers or chills, no weight changes, no lower extremity edema, no neurologic  symptoms, no GI or GU symptoms, no visual changes, no musculoskeletal  symptoms, no psychiatric symptoms, other systems are reviewed and are negative      Physical Exam    Vitals & Measurements    **T: **97.9 F  (Oral) **HR: **74 **RR: **23 **BP: **102/73 **SpO2: **98%    **HT: **152 cm **WT: **70 Kg **BMI: **30.3    General: Alert and oriented, no acute distress    Eye: Normal Conjunctiva    HEENT: Normocephalic, moist oral mucosa    Neck: supple, nontender, no JVD    Respiratory: Lungs clear to auscultation, respirations non-labored, breath  sounds equal and regular, symmetrical  chest expansion, no chest wall  tenderness    Cardiovascular: Tachycardic Rate, irregular rhythm, no gallop, good pulses  equal in all extremities, no edema.    Gastrointestinal: Abdomen soft, non tender, non-distended, normal bowel sounds  all four quadrants, no organomegaly    Musculoskeletal: normal ROM, normal strength, no tenderness,    Neurologic: Alert, Oriented, normal sensory, normal motor, no focal deficits.    Cognition and Speech: Oriented, speech clear and coherent    Psychiatric: Cooperative, appropriate mood & affect        CARDIAC TESTING:        EKG: Afib RVR        Tele: Afib RVR        ECHO: Pending      Diagnostic Results    Impression and Plan    1. Atrial fibrillation     New atrial fibrillation in a patient with active alcohol abuse. Stroke risk  factors are minimal. I would continue with rate control with IV diltiazem drip  and add oral beta blocker/oral calcium blocker in hopes of converting her back  to sinus rhythm. While in hospital use anticoagulation for prevention of  atrial thrombus. If she does not convert to sinus rhythm overnight I would  recommend TEE guided cardioversion in the morning. Patient would require 4  weeks of systemic anticoagulation post-cardioversion. Check TSH for alternate  etiology of her atrial fibrillation  and consider d-dimer to exclude pulmonary  embolism as a cause of her new A. fib. She has not appear to have chest  discomfort or dyspnea. Check ECHO to make sure she has a structurally normal  heart and does not have congenital heart issues or dilataed LV/LA related to  ETOH use. Patient was educated that ETOH may be the cause of her afib. She was  told to wean herself off ETOH.      2. Alcohol abuse     Patient reports to drinking 7 shots of tequila nightly while preparing dinner.  She was told in no uncertain terms that this alcohol abuse may be related to  her atrial fibrillation and she should remain free of all call use. She is  also instructed that in  order to stop drinking she must wean herself from  alcohol to avoid withdrawal and withdrawal seizures. She understands. While in  hospital she will require withdrawal prophylaxis.          Problem List/Past Medical History    Ongoing    Alcohol abuse    IBS - Irritable bowel syndrome    Irritable bowel syndrome    Migraines    Historical    No qualifying data    Medications    _Inpatient_    Dextrose 5% in Water 100 mL + Cardizem IV Additive 125 mg    folic acid, 1 mg= 1 tab(s), PO, Daily    Lovenox, 70 mg= 0.7 mL, 1 mg/Kg, sc, q12hr    metoclopramide, 10 mg= 2 mL, IV Push, q6hr, PRN    Metoprolol Tartrate (Lopressor), 25 mg= 1 tab(s), PO, BID    multivitamin with minerals, 1 tab(s), PO, Daily    ondansetron, 4 mg= 2 mL, IV Push, q8hr, PRN    pantoprazole, 40 mg= 1 tab(s), PO, Daily    PHENobarbital, 64.8 mg, PO, q8hr    PHENobarbital, 64.8 mg, PO, q12hr    PHENobarbital, 32.4 mg, PO, q12hr    PHENobarbital, 64.8 mg, PO, q6hr    PHENobarbital, 65 mg= 1 mL, IV Push, q8hr, PRN    thiamine, 100 mg= 1 tab(s), PO, Daily    Tylenol 325 mg oral tablet, 650 mg= 2 tab(s), PO, q4hr, PRN    _Home_    Alyacen 1/35 oral tablet, 1 tab(s), PO, Daily, TAKE 1 TABLET BY MOUTH EVERY  DAY    Allergies    amoxicillin    penicillins    Social History    _Alcohol_    Current, Liquor, Daily    _Substance Abuse_ \- Denies Substance Abuse    _Tobacco_ \- Denies Tobacco Use    Family History    Asthma: Mother.    Colon: Negative: Mother, Father, Sister, Brother, Daughter, Son, Mat.  Emelia Loron, Mat. Grandmother, [place]. Grandfather and Dennie Bible. Grandmother.    Lab Results     BUN: 11 mg/dL (16/10/96 04:54:09)    ---    CO2: 23 mmol/L (06/08/18 09:09:00)    Creatinine: 0.681 mg/dL (81/19/14 78:29:56)    Glucose Lvl: 108 mg/dL (21/30/86 57:84:69)    Hct: 43.7 % (06/08/18 09:09:00)    Platelet: 301 thous/mm3 (06/08/18 09:09:00)    Potassium Lvl: 3.4 mmol/L Low (06/08/18 09:09:00)    Sodium Lvl: 139 mmol/L (06/08/18 09:09:00)    Troponin I: <0.015  (06/08/18 09:09:00)    WBC: 7.9 thous/mm3 (06/08/18 09:09:00)        SIGNATURE LINE Electronically signed by Earney Mallet MD,Mekayla Soman J on 06/08/2018 at  15:48:47 EST

## 2018-06-09 LAB — HX HCG QUALITATIVE
CASE NUMBER: 2019199000206
HX HCG QUALITATIVE: NEGATIVE

## 2018-06-11 ENCOUNTER — Ambulatory Visit (HOSPITAL_BASED_OUTPATIENT_CLINIC_OR_DEPARTMENT_OTHER): Admitting: Cardiovascular Disease

## 2018-06-14 ENCOUNTER — Emergency Department
Admit: 2018-06-14 | Disposition: A | Discharge status: Home / Self Care | Source: Home / Self Care | Attending: Emergency Medicine | Admitting: Emergency Medicine

## 2018-06-15 LAB — HX COMPREHENSIVE METABOLIC PANEL
CASE NUMBER: 2019205000204
HX ALBUMIN LVL: 3.7 g/dL (ref 3.2–5.0)
HX ALKALINE PHOSPHATASE: 42 U/L (ref 30.0–117.0)
HX ALT: 48 U/L (ref 6.0–55.0)
HX ANION GAP: 8 (ref 3.0–11.0)
HX AST: 22 U/L (ref 6.0–40.0)
HX BILIRUBIN TOTAL: 0.2 mg/dL (ref 0.2–1.2)
HX BUN: 11 mg/dL (ref 6.0–20.0)
HX CALCIUM LVL: 8.9 mg/dL (ref 8.5–10.5)
HX CHLORIDE: 106 mmol/L (ref 98.0–110.0)
HX CO2: 23 mmol/L (ref 21.0–32.0)
HX CREATININE: 0.557 mg/dL (ref 0.55–1.3)
HX GLUCOSE LVL: 84 mg/dL (ref 70.0–110.0)
HX POTASSIUM LVL: 3.9 mmol/L (ref 3.6–5.2)
HX SODIUM LVL: 137 mmol/L (ref 136.0–146.0)
HX TOTAL PROTEIN: 7.3 g/dL (ref 6.0–8.4)

## 2018-06-15 LAB — HX URINE DIPSTICK W/REFLEX
CASE NUMBER: 2019205000205
HX UA BILIRUBIN: NEGATIVE
HX UA GLUCOSE: NEGATIVE
HX UA KETONES: NEGATIVE
HX UA LEUKOCYTE ESTERASE: NEGATIVE
HX UA NITRITE: NEGATIVE
HX UA PH: 6 (ref 5.0–8.0)
HX UA PROTEIN: NEGATIVE
HX UA RBC: 3 /HPF — ABNORMAL HIGH (ref 0.0–2.0)
HX UA SPECIFIC GRAVITY: 1.019 (ref 1.003–1.03)
HX UA SQUAMOUS EPITHELIAL: <1 (ref 0.0–5.0)
HX UA UROBILINOGEN: NEGATIVE
HX UA WBC: 1 /HPF (ref 0.0–5.0)

## 2018-06-15 LAB — HX CBC W/ DIFF
CASE NUMBER: 2019205000204
HX ABSOLUTE NRBC COUNT: 0 10*3/uL
HX HCT: 41.2 % (ref 36.0–47.0)
HX HGB: 13.3 g/dL (ref 11.8–16.0)
HX MCH: 27.5 pg (ref 26.0–34.0)
HX MCHC: 32.3 g/dL (ref 31.0–37.0)
HX MCV: 85.1 fL (ref 80.0–100.0)
HX MPV: 9.9 fL (ref 9.4–12.4)
HX NRBC PERCENT: 0 %
HX PLATELET: 262 10*3/uL (ref 150.0–400.0)
HX RBC: 4.84 10*6/uL (ref 3.9–5.2)
HX RDW-CV: 14.1 % (ref 11.5–14.5)
HX RDW-SD: 43.6 fL (ref 35.0–51.0)
HX WBC: 9.8 10*3/uL (ref 3.7–11.2)

## 2018-06-15 LAB — HX .AUTOMATED DIFF
CASE NUMBER: 2019205000204
HX ABSOLUTE BASO COUNT: 0.05 10*3/uL (ref 0.0–0.22)
HX ABSOLUTE EOS COUNT: 0.25 10*3/uL (ref 0.0–0.45)
HX ABSOLUTE LYMPHS COUNT: 3.24 10*3/uL (ref 0.74–5.04)
HX ABSOLUTE MONO COUNT: 0.84 10*3/uL (ref 0.0–1.34)
HX ABSOLUTE NEUTRO COUNT: 5.43 10*3/uL (ref 1.48–7.95)
HX BASOPHILS: 0.5 %
HX EOSINOPHILS: 2.5 %
HX IMMATURE GRANULOCYTES: 0.2 % (ref 0.0–2.0)
HX LYMPHOCYTES: 33 %
HX MONOCYTES: 8.5 %
HX NEUTROPHILS: 55.3 %

## 2018-06-15 LAB — HX GLOMERULAR FILTRATION RATE (ESTIMATED)
CASE NUMBER: 2019205000204
HX AFN AMER GLOMERULAR FILTRATION RATE: >90
HX NON-AFN AMER GLOMERULAR FILTRATION RATE: >90

## 2018-06-15 LAB — HX HCG QUALITATIVE URINE
CASE NUMBER: 2019205000205
HX HCG QUAL URINE: NEGATIVE

## 2018-06-15 LAB — HX SST GOLD TUBE TO HOLD: CASE NUMBER: 2019205000204

## 2018-06-15 LAB — HX BLUE TOP TO HOLD: CASE NUMBER: 2019205000204

## 2018-06-15 LAB — HX TROPONIN I
CASE NUMBER: 2019205000204
HX TROPONIN I: <0.015 (ref 0.015–0.045)

## 2018-06-18 ENCOUNTER — Ambulatory Visit: Admitting: Rheumatology

## 2018-07-01 ENCOUNTER — Ambulatory Visit

## 2018-07-07 ENCOUNTER — Ambulatory Visit: Admitting: Nurse Practitioner

## 2018-07-07 NOTE — Progress Notes (Signed)
* * *         [facility]**        ---    Kieth Brightly. Donnetta Simpers, MD [facility] Barbera Setters Byrd Hesselbach, MD [facility];    Darlyn Chamber, MD [facility] Roosvelt Maser, MD [facility] Shanon Brow, MD [facility]  Nile Dear. Audley Hose, MD [facility]    Kandis Mannan A. Karie Mainland, MD [facility] Johnell Comings. MacNaught, MD [facility] Mitchel Honour, MD Erma Pinto, MD [facility]    Colletta Maryland, MD Weston Anna, MD [facility] Charlesetta Shanks, MD Kerby Moors, NP    Tonna Corner, NP Danelle Earthly, NP Caroleen Hamman, NP        * * *     **Patient Name:** [name]   **Date:** 07/07/2018    --- ---     **DOB:** 1980/12/10     **Referring Provider:** Charna Archer, MD   **Appointment Provider:** Darrell Jewel, NP        * * *    07/07/2018   **Appointment Provider:** Darrell Jewel, NP    --- ---      **Supervising Provider:** ERIC Francie Massing, MD    ---        Current Medications    ---    Taking     * Aspir 81 81 mg delayed release tablet 1 tab(s) orally once a day    ---    * folic acid 1 mg tablet 1 tab(s) orally once a day    ---    * multivitamin with minerals Multiple Vitamins with Minerals capsule 1 cap(s) orally once a day    ---    * thiamine 100 mg tablet 1 tab(s) orally once a day    ---    * verapamil 180 mg/24 hours capsule, extended release 1 cap(s) orally once a day    ---    * Fioricet 300 mg-50 mg-40 mg capsule 1 cap(s) orally every 4 hours    ---    * Medication List reviewed and reconciled with the patient    ---      Past Medical History    ---      Atrial fibrillation with RVR    ---    IBS, possibly Chrohn's    ---    Alcohol abuse    ---      Family History    ---      Paternal Grand Father: diagnosed with Cancer    ---    Maternal Grand Mother: Diabetes    ---    Paternal uncle: Cancer    ---    Maternal uncle: Cancer    ---      Social History    ---    Advised to lose weight: Yes .    no Tobacco Patient counseled on the dangers of tobacco use: 07/07/2018.    Alcohol: Previous significant alcohol use, reports complete  cessation since  hospitalization 05/2018.Marland Kitchen   Patient lives with her fiance and their 2 children.    She works as an Producer, television/film/video at [employer].    ---      Allergies    ---      penicillin    ---    sumatriptan    ---    Toprol-XL: malaise, migraines - Criticality Low    ---      Review of Systems    ---     _BC_ :    Fatigue No.  Weight gain No. Weight loss No. Weakness No. Fevers No. Chills No.  Shortness of breath No . Cough No . Dyspnea on exertion No. Wheezes No. Chest  pain No. Chest tightness No. Palpitations No. LE edema No. PND No. Orthopnea  No. Presyncope No. Syncope No. Lightheadedness Yes, mild, rare positional as  per HPI. Dizziness No. Falls No. Easy bleeding/bruising No. Nausea No.  Vomiting No. Abdominal pain No. Melena, black stools No. Joint pains No.  Claudication No.            Reason for Appointment    ---      1\. Hospital follow-up for new atrial fibrillation    ---      History of Present Illness    ---     _CP_ :    This is a very pleasant 37 year old female with history of inflammatory bowel  disease presumed to be Crohn's who is status post hospitalization for new  onset atrial fibrillation with rapid ventricular response in the setting of  excessive intake, caffeine, increased stress. Atrial fibrillation was  associated with pounding sensation in the chest, dizziness, nausea and  vomiting. She was chemically converted via diltiazem drip. Patient was  discharged on Toprol, baby aspirin. She was also initiated on Foley catheter,  thiamine and encouraged to abstain from alcohol.    Since hospital discharge, patient developed returned to the emergency  department several days following her discharge in the setting of migraine as  well as significant malaise, weakness. She was appears at that time. She  followed up with her PCP who changed her Toprol to verapamil in the setting of  significant malaise, weakness felt to be a side effect of Toprol regimen.    Patient has felt well  following transition to verapamil. She reports complete  abstinence from alcohol since her hospitalization, which she is commended on.  She also reports starting an exercise program and maintaining a well-balanced  diet. She does inquire about longevity of her medication regimen as she would  like to limit medications if possible.    Patient denies any recurrence of symptoms. She does report occasional, mild  positional lightheadedness which may be secondary to her new medication  regimen. She reports that her migraines have significantly improved.      Vital Signs    ---    HR 71, BP 116/84, Wt 173, Ht 61, BMI 32.68, Med Assist: MB.      Examination    ---     _HS_ :    General: well developed, obese female in NAD. Eyes PERRLA, Conjunctiva pale.  Neck: no JVD, bruits, or lymphadenopathy. Pulmonary System clear to  auscultation bilaterally. Cardiac: PMI is nondisplaced, normal S1 S2, no  murmur, gallop or pericardial rub. GI SYSTEM soft, NT/ND, BS present, no  masses, No bruit. Extremities no clubbing, no edema. Peripheral pulses: normal  (2+) bilaterally. Skin: normal, no rash. Neurologic exam: grossly non-focal.  Pyschiatric appropriate.          Data    ---     _EKG (reviewed personally)_ :    07/07/2018: NSR, HR 71.          Impression/Recommendations    ---       **1\. Paroxysmal atrial fibrillation**    _IMAGING: **EKG_    Clinical Notes:    s/p Hospitalization for new onset Atrial fibrillation with RVR. Patient was  initially chemically converted via diltiazem drip, discharge on baby aspirin  and beta blocker. Beta  blocker has since been transitioned to verapamil  therapy secondary to significant side effects to beta blocker.EKG today  demonstrates normal sinus rhythm, HR 71 BPM. Patient commended on alcohol  cessation, new exercise regimen. She reports compliance with medications,  continue current regimen at this time. Follow-up with Dr. Illene Bolus 9/24 as  arranged.    .    ---        **2\. Alcohol abuse,  uncomplicated**    Clinical Notes:    Currently taking Thiamine and Folic acid as prescribed. Patient reports  complete alcohol cessation, she is commended on this and encouraged to  continue at this time.    .        Diagnostic Imaging    --- ---    Ardine Bjork: WORK NOTE (Ordered for 07/07/2018)_    ---       Procedure Codes    ---      93000 IH    ---      Follow Up    ---    6 Months w/ HS    **Appointment Provider:** Darrell Jewel, NP    Electronically signed by Arlis Porta MD on 07/07/2018 at 05:50 PM EDT    Sign off status: Completed        * * *        [facility]    [address]    [place], [place] [address]    Tel: [phone]    Fax: [phone]              * * *          Patient: Tiffany Leach, Tiffany Leach DOB: 07-25-1981 Progress Note: Darrell Jewel, NP  07/07/2018    ---    Note generated by eClinicalWorks EMR/PM Software ([email or web address])

## 2018-07-15 ENCOUNTER — Ambulatory Visit: Admitting: Internal Medicine

## 2018-10-26 IMAGING — CT CT HEAD W/O CM
4 series · 16 of 47 positions shown, 18 images · non-contrast
Comparison: None.

CLINICAL DATA: 36-year-old female with acute headache for 1 week.
Treated with IV migraine cocktail yesterday but progressive symptoms
today. Bilateral extremity paresthesias.

EXAM:
CT HEAD WITHOUT CONTRAST
TECHNIQUE: Contiguous axial images were obtained from the base of the skull
through the vertex without intravenous contrast.

[Series 3: head without · axial · non-contrast · 0.39mm/px · z∈[-79,+31]mm · 7 of 30 slices shown, 9 images]
[im 4/30  brain]
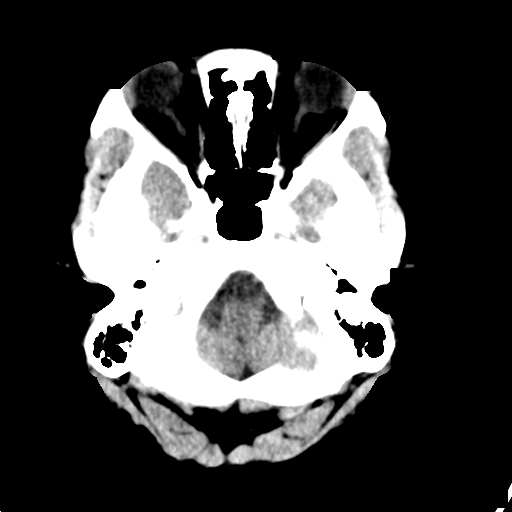
[im 4/30  bone]
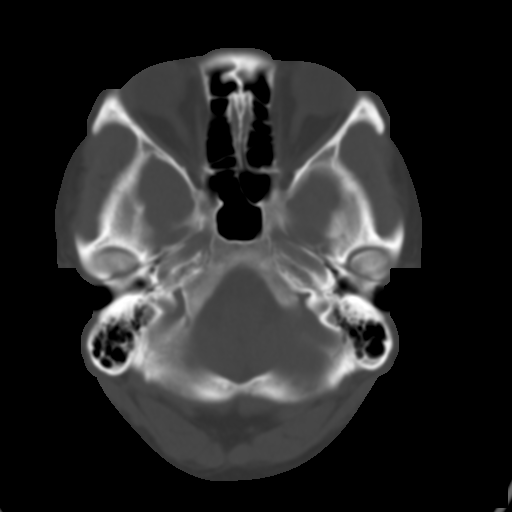
[im 8/30  brain]
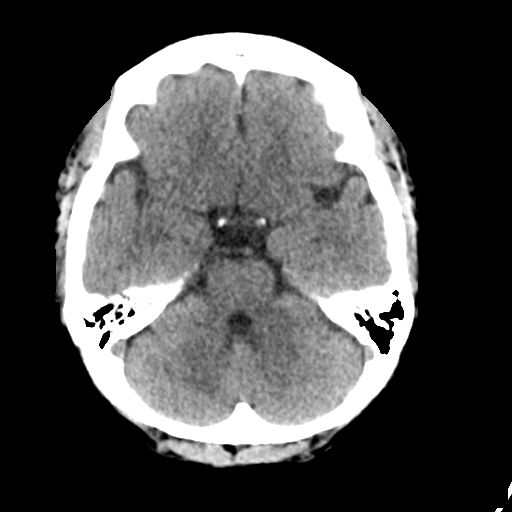
[im 11/30  brain]
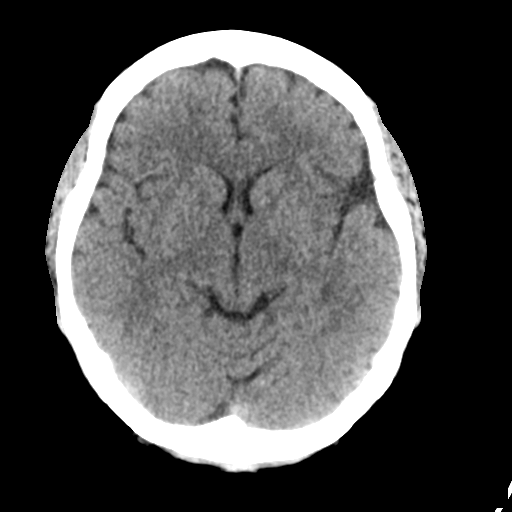
[im 15/30  brain]
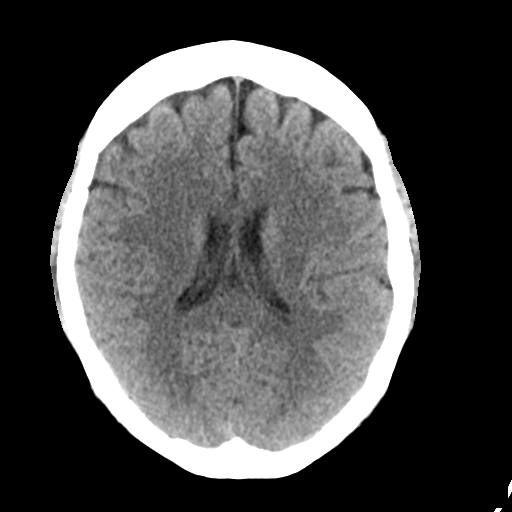
[im 19/30  brain]
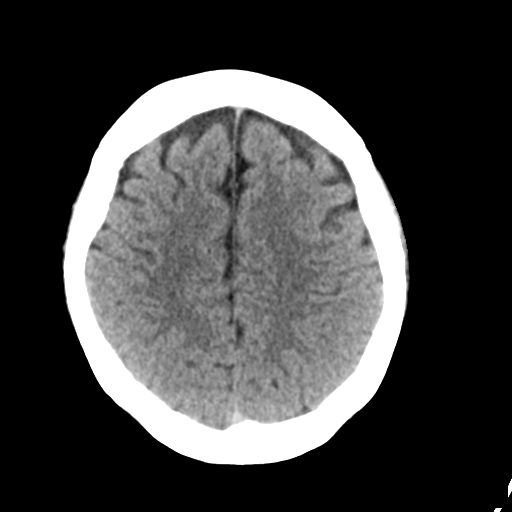
[im 19/30  bone]
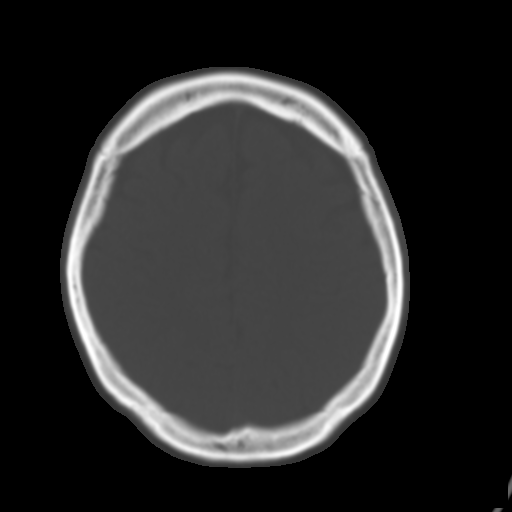
[im 22/30  brain]
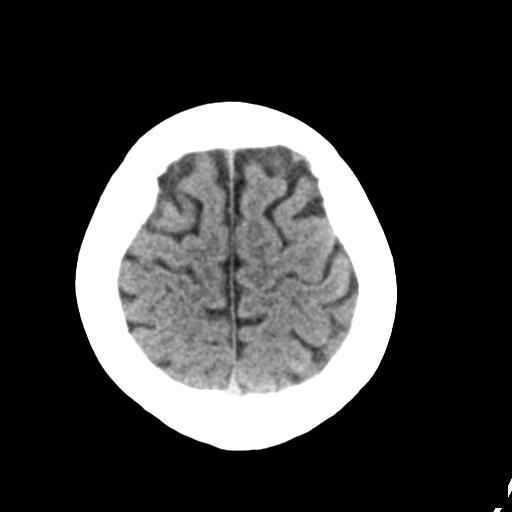
[im 26/30  brain]
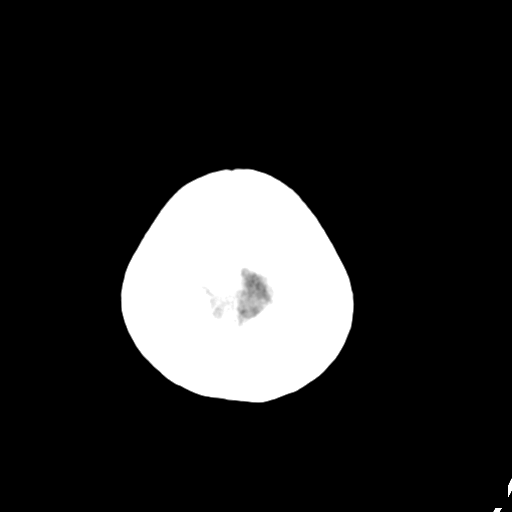

[Series 4: head bone · axial · 0.39mm/px · z∈[-80,-52]mm · 3 of 74 slices shown]
[im 8/74  bone]
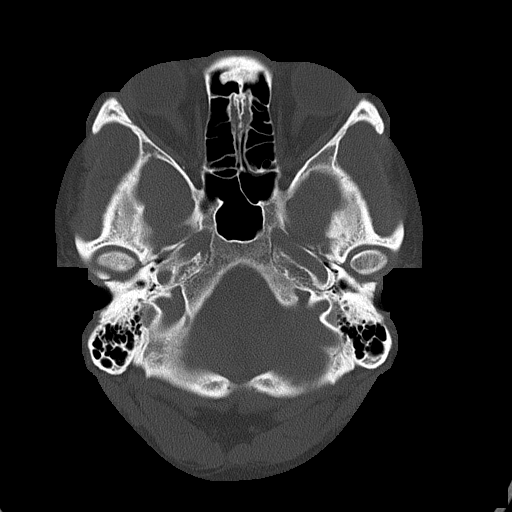
[im 15/74  bone]
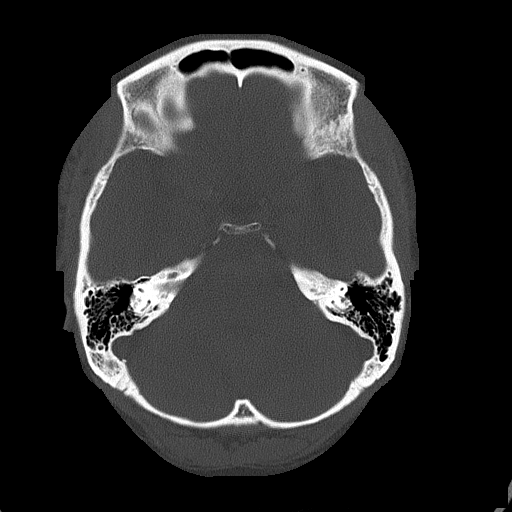
[im 22/74  bone]
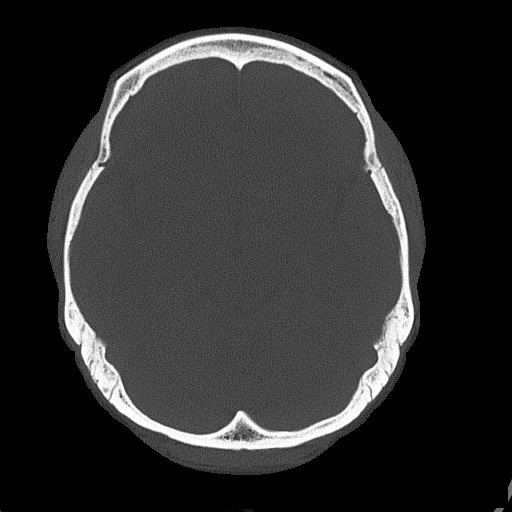

[Series 5: head without cor · coronal · non-contrast · 0.29mm/px · 3 of 62 slices shown]
[im 21/62  brain]
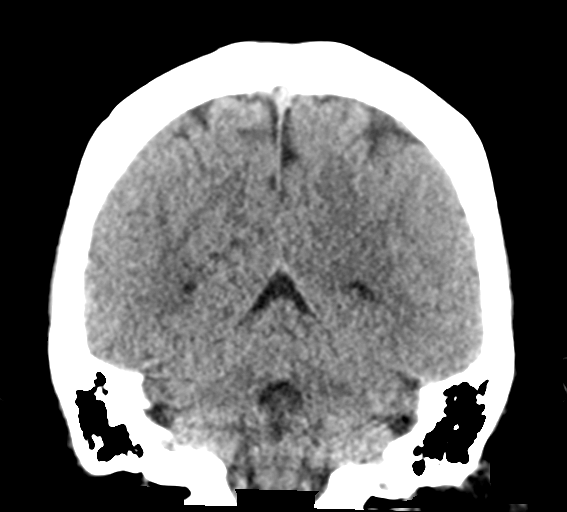
[im 28/62  brain]
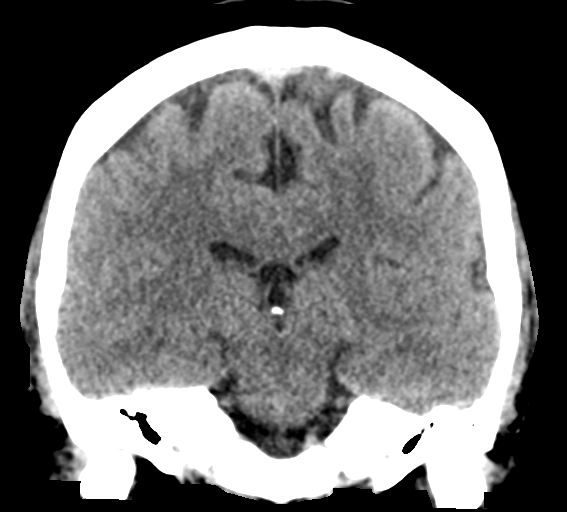
[im 34/62  brain]
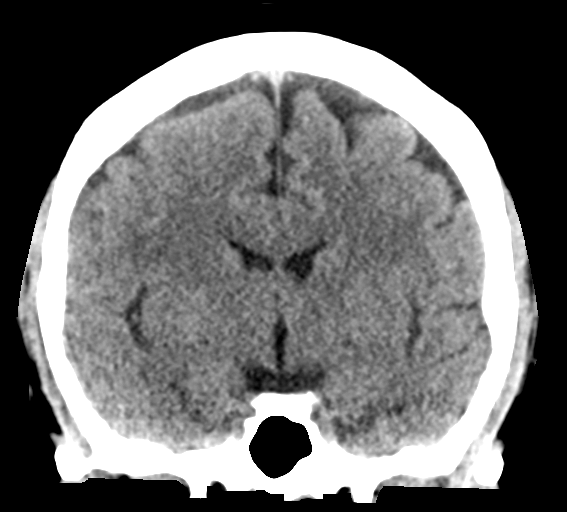

[Series 6: head without sag · sagittal · non-contrast · 0.29mm/px · 3 of 59 slices shown]
[im 20/59  brain]
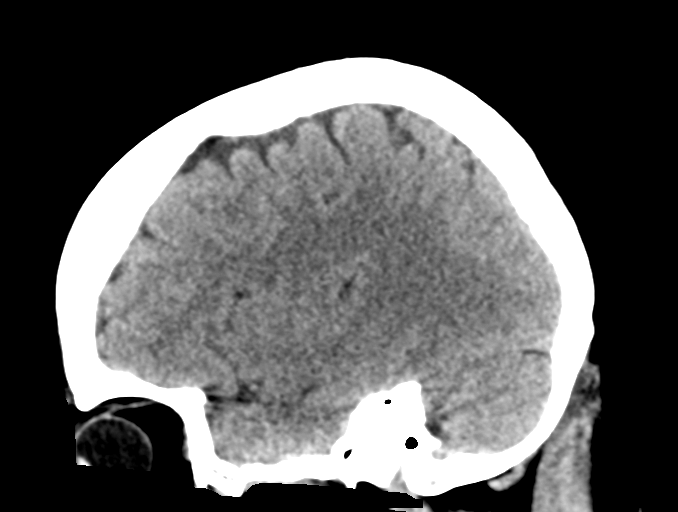
[im 30/59  brain]
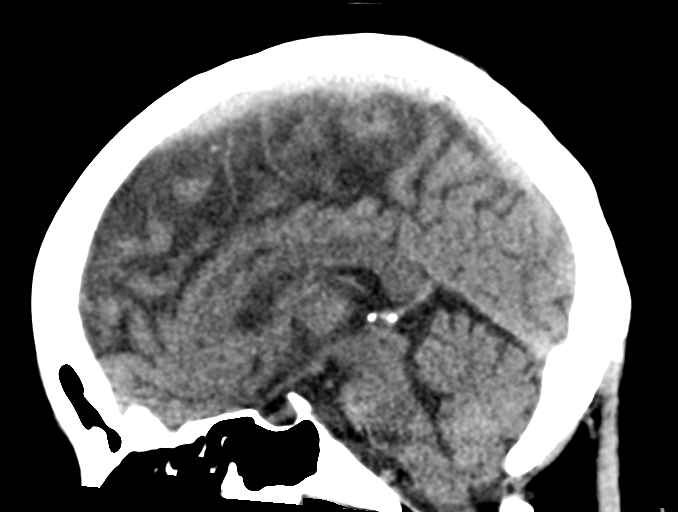
[im 39/59  brain]
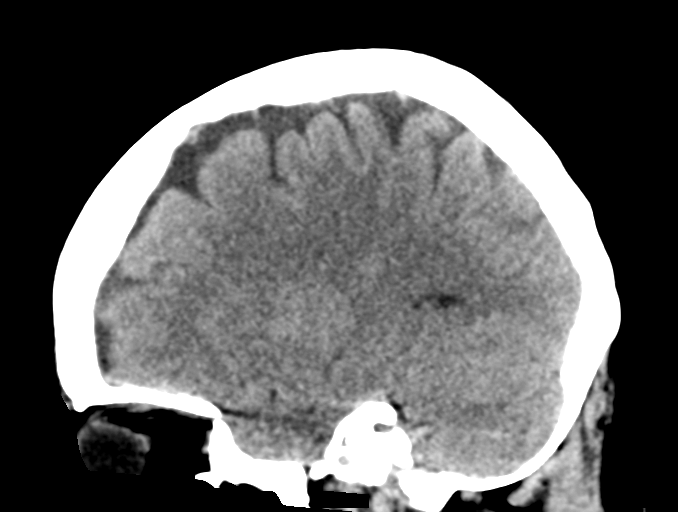

[16 of 47 positions shown; findings below may reference images not displayed]

FINDINGS: Brain: Cerebral volume is within normal limits. No midline shift,
ventriculomegaly, mass effect, evidence of mass lesion, intracranial
hemorrhage or evidence of cortically based acute infarction.
Gray-white matter differentiation is within normal limits throughout
the brain.

Vascular: No suspicious intracranial vascular hyperdensity.

Skull: Negative.  No acute osseous abnormality identified.

Sinuses/Orbits: Clear.

Other: Visualized orbit soft tissues are within normal limits.
Visualized scalp soft tissues are within normal limits.
IMPRESSION: Normal noncontrast CT appearance of the brain.

## 2018-12-06 ENCOUNTER — Ambulatory Visit: Admitting: Cardiovascular Disease

## 2018-12-06 NOTE — Progress Notes (Signed)
* * *      **  Tiffany Leach**    ------    28 Y old Female, DOB: 04/25/1981    [address], [place] [address]    Home: [phone]    Provider: Darlyn Chamber, MD        * * *    Telephone Encounter    ---    Answered by  Waylan Boga Date: 12/06/2018       Time: 02:29 PM    Reason  March appt    ------            Message                     This pt has an appt with Dr. Doug Sou on 03/04, but I need to bump it. He has nothing for follow ups until September. Does it need to be with him, or can it be with another MD?                Action Taken                     CHAMPION,CHRISTIE , NP 12/06/2018 2:39:58 PM > I think it would be fine with another MD, thanks!      Poitras,Michelle  12/07/2018 8:52:37 AM > Booked for 02/17/19 w/[facility].                    * * *                ---          * * *         Patient: Tiffany Leach DOB: Apr 07, 1981 Provider: Darlyn Chamber, MD  12/06/2018    ---    Note generated by eClinicalWorks EMR/PM Software ([email or web address])

## 2019-03-02 ENCOUNTER — Ambulatory Visit: Admitting: Cardiovascular Disease

## 2019-03-02 NOTE — Progress Notes (Signed)
* * *      Lunt, Caylee **DOB:** [birth year] (37 yo F) **Acc No.** [id number] **DOS:**  03/02/2019    ---       Wynona Neat, Mitzi Davenport**    ------    13 Y old Female, DOB: 1981-08-07    [address] [place], [place] [address]    Home: [phone]    Provider: Darlyn Chamber, MD        * * *    Telephone Encounter    ---    Answered by  Jackelyn Hoehn Date: 03/02/2019       Time: 11:15 AM    Reason  ?televisit    ------            Message                     You have not seen this pt yet.  CC saw her once.  Will you be able to do a televisit with her or would you like me to put back on CCs schedule for a televisit?  pls advise.  thanks  --  pl                Action Taken                     [facility] , MD 03/02/2019 12:30:06 PM > CC schedule please      LaBranche,Pamela  03/03/2019 9:14:41 AM > LMM for a tlelevisit with CC on 4/27 at 1:00pm  --  pl                    * * *                ---          * * *         Provider: Darlyn Chamber, MD 03/02/2019    ---    Note generated by eClinicalWorks EMR/PM Software ([email or web address])

## 2019-03-20 ENCOUNTER — Ambulatory Visit (HOSPITAL_BASED_OUTPATIENT_CLINIC_OR_DEPARTMENT_OTHER)

## 2019-03-20 ENCOUNTER — Ambulatory Visit: Admitting: Nurse Practitioner

## 2019-03-20 NOTE — Progress Notes (Signed)
 * * *        [facility] [place] [facility]**        ---    Kieth Brightly. Donnetta Simpers, MD [facility] Barbera Setters Byrd Hesselbach, MD [facility];    Darlyn Chamber, MD [facility] Roosvelt Maser, MD [facility] Shanon Brow, MD [facility]  Nile Dear. Audley Hose, MD [facility]    Kandis Mannan A. Karie Mainland, MD [facility] Johnell Comings. MacNaught, MD [facility] Mitchel Honour, MD Erma Pinto, MD [facility]    Colletta Maryland, MD Weston Anna, MD [facility] Charlesetta Shanks, MD Kerby Moors, NP    Tonna Corner, NP Danelle Earthly, NP Caroleen Hamman, NP        * * *     **Patient Name:** Tiffany Leach  **Date:** 03/20/2019    ------     **DOB:** 15-Sep-1981     **Referring Provider:** Charna Archer, MD  **Appointment Provider:** Darrell Jewel, NP        * * *    03/20/2019  **Appointment Provider:** Darrell Jewel, NP    ------     **Supervising Provider:** Trecia Rogers, MD    ---      Current Medications    ---    Taking    * Aspir 81 81 mg delayed release tablet 1 tab(s) orally once a day    ---    * folic acid 1 mg tablet 1 tab(s) orally once a day    ---    * multivitamin with minerals Multiple Vitamins with Minerals capsule 1 cap(s) orally once a day    ---    * acetaminophen/butalbital/caffeine 325 mg-50 mg-40 mg tablet TAKE 1 TABLET BY MOUTH EVERY 6 HOURS AS NEEDED     ---    * Alyacen 1/35 35 mcg-1 mg tablet TAKE 1 TABLET BY MOUTH EVERY DAY     ---    * Vitamin B1 100 mg tablet 1 tab(s) orally once a day    ---    * Vitamin D3 2000 intl units tablet as directed orally once a day    ---    * Medication List reviewed and reconciled with the patient    ---     Past Medical History    ---     Atrial fibrillation with RVR    ---    IBS, possibly Chrohn's    ---    Alcohol abuse    ---     Family History    ---     Paternal Grand Father: diagnosed with Cancer    ---    Maternal Grand Mother: Diabetes    ---    Paternal uncle: Cancer    ---    Maternal uncle: Cancer    ---     Social History    ---    no Tobacco .    Advised to lose weight: Yes .    Alcohol:  Previous significant alcohol use, reports complete cessation since  hospitalization 05/2018.Marland Kitchen  Patient lives with her fiance and their 2 children.    She works as an Producer, television/film/video at [employer].    ---     Allergies    ---     penicillin    ---    sumatriptan    ---    Toprol-XL: malaise, migraines - Criticality Low    ---     Review of Systems    ---     _BC_ :    Fatigue No.  Weight gain No. Weight loss No. Weakness No. Fevers No. Chills No.  Shortness of breath No . Cough No . Dyspnea on exertion No. Wheezes No. Chest  pain No. Chest tightness No. Palpitations No. LE edema No. PND No. Orthopnea  No. Presyncope No. Syncope No. Lightheadedness No. Dizziness No. Falls No.  Easy bleeding/bruising No. Nausea No. Vomiting No. Abdominal pain No. Melena,  black stools No. Joint pains No. Claudication No.    Continues with occasional migraines.      Reason for Appointment    ---     1\. Follow up atrial fibrillation    ---     History of Present Illness    ---     _CP_ :    Telehealth encounter during ongoing Covid 19 pandemic . This telehealth visit  was done using telephone only as a video was not available. The episode of  care was initiated by conversion from an in person scheduled visit. The  patient provided informed consent to participate in this telehealth visit. The  patient has already established care in our office and understands that the  services delivered by the provider are part of their continuing care and they  have been made aware of how to access in person care if needed. The physical  location of the provider of care is within the [facility] system and the  location of the patient is at the setting of their choice separate from the  provider of care at the time this services were delivered.    This is a very pleasant 38 year old female with history of inflammatory bowel  disease presumed to be Crohn's who was last seen 06/2018 status post  hospitalization for new onset atrial  fibrillation with rapid ventricular  response in the setting of excessive intake, caffeine, increased stress.  Atrial fibrillation was associated with pounding sensation in the chest,  dizziness, nausea and vomiting. She was chemically converted via diltiazem  drip. Patient was discharged on Toprol, baby aspirin. She was also initiated  on folic acid, thiamine and encouraged to abstain from alcohol.    She was initially discharged on Toprol which was transitioned to verapamil  secondary to side effects including migraine, malaise, weakness. She tells me  her verapamil was discontinued approximately 1-2 months after our visit  secondary to similar side effects. She has had no recurrent atrial ablation  symptoms. She continues on baby aspirin at this time with no bruising or  bleeding issues.    She reports complete abstinence from alcohol since her hospitalization, which  she is commended on. She tells me she was involved in an exercise program at a  gym, however, due to coronavirus pandemic this has been interrupted. She  reports maintaining a well-balanced diet.     Data    ---     _EKG (reviewed personally)_ :    07/07/2018: NSR, HR 71.         Impression/Recommendations    ---      **1\. Paroxysmal atrial fibrillation**    Clinical Notes:    Presumed isolated incident of Atrial fibrillation with RVR related to  exacerbating factors as above. per patient both beta blocker and verapamil  have been discontinued secondary to side effects, she reports verapamil was  discontinued per phone conversation with Dr. Illene Bolus several monts ago.  No recurrent symptoms/events.    She remains on ASA at this time with consideration of discontinuation pending  course.  She continues with alcohol cessation, exercise regimen for which she is  commended on.          .    ---        **2\. Alcohol abuse, uncomplicated**    Clinical Notes:    Currently taking Folic acid, vitamin B-1 as prescribed. Patient reports  complete  alcohol cessation, she is commended on this and encouraged to  continue at this time.    .     Follow Up    ---    Debarah [facility] ? does she have [facility] appt?    **Appointment Provider:** Darrell Jewel, NP    Electronically signed by Darlyn Chamber MD on 03/24/2019 at 03:41 PM EDT    Sign off status: Completed        * * [facility]    [address]    [place], [place] [address]    Tel: [phone]    Fax: [phone]              * * *         Patient: Tiffany Leach, Tiffany Leach DOB: 1981-08-31 Progress Note: Darrell Jewel, NP  03/20/2019    ---    Note generated by eClinicalWorks EMR/PM Software ([email or web address])

## 2020-06-27 ENCOUNTER — Ambulatory Visit: Admitting: Physician Assistant

## 2020-06-27 LAB — HX .RAPID STREP A PCR POC
CASE NUMBER: 2021217002137
HX STREP A PCR POC: NOT DETECTED

## 2020-06-27 LAB — HX .SARS/FLU PCR POC
CASE NUMBER: 2021217002166
HX INFLUENZA A POC: NOT DETECTED
HX INFLUENZA B POC: NOT DETECTED
HX SARS PCR POC: DETECTED — AB

## 2020-06-30 ENCOUNTER — Inpatient Hospital Stay
Admit: 2020-06-30 | Disposition: A | Discharge status: Home / Self Care | Source: Home / Self Care | Attending: Internal Medicine | Admitting: Internal Medicine

## 2020-06-30 LAB — HX CBC W/ DIFF
CASE NUMBER: 2021220001127
HX ABSOLUTE NRBC COUNT: 0 10*3/uL
HX HCT: 44.9 % — NL (ref 36.0–47.0)
HX HGB: 14.6 g/dL — NL (ref 11.8–16.0)
HX MCH: 25.8 pg — ABNORMAL LOW (ref 26.0–34.0)
HX MCHC: 32.5 g/dL — NL (ref 31.0–37.0)
HX MCV: 79.5 fL — ABNORMAL LOW (ref 80.0–100.0)
HX MPV: 9.4 fL — NL (ref 9.4–12.4)
HX NRBC PERCENT: 0 % — NL
HX PLATELET: 162 10*3/uL — NL (ref 150.0–400.0)
HX RBC: 5.65 10*6/uL — ABNORMAL HIGH (ref 3.9–5.2)
HX RDW-CV: 13.3 % — NL (ref 11.5–14.5)
HX RDW-SD: 38.1 fL — NL (ref 35.0–51.0)
HX WBC: 3.3 10*3/uL — ABNORMAL LOW (ref 3.7–11.2)

## 2020-06-30 LAB — HX D-DIMER QUANTITATIVE
CASE NUMBER: 21485631
HX D-DIMER QUANT: 1.29 mg{FEU}/L — ABNORMAL HIGH

## 2020-06-30 LAB — HX COMPREHENSIVE METABOLIC PANEL
CASE NUMBER: 2021220001127
HX ALBUMIN LVL: 3.4 g/dL — NL (ref 3.2–5.0)
HX ALKALINE PHOSPHATASE: 50 U/L — NL (ref 30.0–117.0)
HX ALT: 91 U/L — ABNORMAL HIGH (ref 6.0–55.0)
HX ANION GAP: 9 — NL (ref 3.0–11.0)
HX AST: 112 U/L — ABNORMAL HIGH (ref 6.0–40.0)
HX BILIRUBIN TOTAL: 0.3 mg/dL — NL (ref 0.2–1.2)
HX BUN: 8 mg/dL — NL (ref 6.0–20.0)
HX CALCIUM LVL: 8.3 mg/dL — ABNORMAL LOW (ref 8.5–10.5)
HX CHLORIDE: 104 mmol/L — NL (ref 98.0–110.0)
HX CO2: 24 mmol/L — NL (ref 21.0–32.0)
HX CREATININE: 0.65 mg/dL — NL (ref 0.55–1.3)
HX GLUCOSE LVL: 88 mg/dL — NL (ref 70.0–110.0)
HX POTASSIUM LVL: 3.5 mmol/L — ABNORMAL LOW (ref 3.6–5.2)
HX SODIUM LVL: 137 mmol/L — NL (ref 136.0–146.0)
HX TOTAL PROTEIN: 7.8 g/dL — NL (ref 6.0–8.4)

## 2020-06-30 LAB — HX GLOMERULAR FILTRATION RATE (ESTIMATED)
CASE NUMBER: 2021220001127
HX AFN AMER GLOMERULAR FILTRATION RATE: >90
HX NON-AFN AMER GLOMERULAR FILTRATION RATE: >90

## 2020-06-30 LAB — HX SST GOLD TUBE TO HOLD: CASE NUMBER: 2021220001127

## 2020-06-30 LAB — HX .AUTOMATED DIFF
CASE NUMBER: 2021220001127
HX ABSOLUTE BASO COUNT: 0.01 10*3/uL — NL (ref 0.0–0.22)
HX ABSOLUTE EOS COUNT: 0 10*3/uL — NL (ref 0.0–0.45)
HX ABSOLUTE LYMPHS COUNT: 1.07 10*3/uL — NL (ref 0.74–5.04)
HX ABSOLUTE MONO COUNT: 0.26 10*3/uL — NL (ref 0.0–1.34)
HX ABSOLUTE NEUTRO COUNT: 1.96 10*3/uL — NL (ref 1.48–7.95)
HX BASOPHILS: 0.3 %
HX EOSINOPHILS: 0 %
HX IMMATURE GRANULOCYTES: 0 % — NL (ref 0.0–2.0)
HX LYMPHOCYTES: 32.4 %
HX MONOCYTES: 7.9 %
HX NEUTROPHILS: 59.4 %

## 2020-06-30 LAB — HX BLUE TOP TO HOLD: CASE NUMBER: 2021220001127

## 2020-06-30 LAB — HX MAGNESIUM LEVEL
CASE NUMBER: 2021220001127
HX MAGNESIUM LVL: 2 mg/dL — NL (ref 1.7–2.5)

## 2020-06-30 LAB — HX FERRITIN
CASE NUMBER: 21484930
HX FERRITIN LVL: 1130 ng/mL — ABNORMAL HIGH (ref 11.0–307.0)

## 2020-06-30 NOTE — H&P (Signed)
 Name :  Tiffany Leach, Tiffany Leach    DOB :  [id number]    Sex :  Female    MRN :  [id number]    Chief Complaint    cough, shortness of breath    History of Present Illness    This is a 39 year old female with no medical history ( as per the patient )  however as per PCP's Note history significant for paroxysmal atrial  fibrillation, alcoholism who comes to the er after being tested positive for  COVID 19.    -She says she started having symptoms of eye pain, photophobia since last week. She says she was having pain in her body and just not feeling well. She says she tested positive for COVID 19 on Thursday and is coming to the hospital because of shortness of breath and cough.    -Says she does not have smell and taste. Says she has not gotten vaccinated. Says her husband is also admitted in this hospital for COVID infection.    Review of Systems    A 14 point review of system was found to be negative other than mentioned in  HPI    Code Status    Code Status - Ordered    -- 06/30/20 21:10:00 EDT, Full Resuscitation, Constant Order    Physical Exam    Vitals & Measurements    **T: **101.7 ??F (Oral) **HR: **73 **RR: **25 **BP: **106/74 **SpO2: **96%  **WT: **77.1 Kg    General: Alert and oriented x 3    Eye: Pupils are equal    HENT: Oral mucosa is moist.    Neck: Supple, Non-tender,no neck rigidity    Respiratory: Respirations are non labored, Lungs are clear to auscultation    Cardiovascular: Normal rate, Regular rhythm, s1 s2 appreciated    Gastrointestinal: Soft, Non-tender, Non-distended.    Genitourinary: No costovertebral angle tenderness    Musculoskeletal Normal range of motion    Integumentary: Warm    Neurologic: Alert, Oriented x3, fluent speech, no facial asymmetry noted    grade 5/5 power in all extremities, sensations intact.    *****************************************************************    (06/30/2020 16:44 EDT XR Chest 1 View Frontal)    IMPRESSION: Patchy bilateral airspace opacities concerning for  early pneumonia  potentially viral etiology. Follow-up chest x-ray posttreatment advised.    Impression and Plan    39 year old female with a medical history of alcohol induced paroxysmal atrial  fibrillation, alcoholism is being admitted for        #Pneumonia due to COVID-19    -Symptoms of fever, cough, shortness of breath and no hypoxia    -Chest x-ray showing patchy bilateral airspace opacities concerning for early pneumonia.    -Elevated ferritin    -D-dimer slightly elevated.        Plan    > Monitor under telemetry for hypoxia    > Supplemental oxygen to keep saturation greater than 94%    > No indication for Remdesivir and dexamethasone at this time        #Paroxysmal atrial fibrillation    -Does not appear to be on beta-blockers or anticoagulation.  Patient has normal sinus rhythm at this time        Admit to [facility]        Face-to-Face  patient counseling, coordinating and planning care more than 50%  of encounter time :Yes    Total encounter time 55 minutes        I certify that hospital inpatient  services are reasonable and medically  necessary. They are appropriately provided as inpatient services in accordance  with the two midnight benchmark under 42CFR 412 3(e), or the services are  specified as inpatient only procedure under 42 CFR 419 22(n)        Problem List/Past Medical History    Ongoing    Alcohol abuse    Irritable bowel syndrome    Historical    No qualifying data    Procedure/Surgical History      * Nerve repair procedure in mouth    Social History    Smoking Status    Former smoker    Alcohol    Current, Liquor, Daily    Substance Use \- Denies Substance Abuse    Tobacco \- Denies Tobacco Use    Former smoker, Cigarettes    Family History    Asthma: Mother.    Colon: Negative: Mother, Father, Sister, Brother, Daughter, Son, Mat.  Emelia Loron, Mat. Grandmother, [place]. Grandfather and Dennie Bible. Grandmother.    Allergies    Imitrex  (breathing)    amoxicillin    penicillins    Medications    _Inpatient_    acetaminophen tablet, 650 mg= 2 tab(s), PO, q4hr, PRN    enoxaparin, 40 mg, sc, q24hr    multivitamin with minerals, 1 tab(s), PO, Daily    NaCl 0.9% bolus 1,000 mL, 1000 mL, IV    ondansetron, 4 mg= 2 mL, IV Push, q8hr, PRN    Sodium Chloride 0.9% Flush, 3 mL, Flush, qshift    Vitamin D3, 1000 Unit(s)= 2.5 tab(s), PO, Daily    _Home_    Alyacen 1/35 oral tablet, 1 tab(s), PO, Daily, TAKE 1 TABLET BY MOUTH EVERY  DAY    Fioricet oral tablet, 1 tab(s), PO, q4hr, PRN    folic acid 1 mg oral tablet, 1 mg= 1 tab(s), PO, Daily    Multiple Vitamins with Minerals oral tablet, 1 tab(s), PO, Daily    Vitamin D3    Diet    Regular Diet - Ordered    -- 06/30/20 18:53:00 EDT, Room Service, Scheduled / PRN    Lab Results    Urine Pregnancy POC: Negative (06/30/20 16:00:00)    Glucose Lvl: 88 mg/dL (79/89/21 19:41:74)    BUN: 8 mg/dL (07/07/47 18:56:31)    Creatinine: 0.65 mg/dL (49/70/26 37:85:88)    Afn Amer Glomerular Filtration Rate: >90 (06/30/20 16:13:00)    Non-Afn Amer Glomerular Filtration Rate: >90 (06/30/20 16:13:00)    Sodium Lvl: 137 mmol/L (06/30/20 16:13:00)    Potassium Lvl: 3.5 mmol/L Low (06/30/20 16:13:00)    Chloride: 104 mmol/L (06/30/20 16:13:00)    CO2: 24 mmol/L (06/30/20 16:13:00)    Anion Gap: 9 (06/30/20 16:13:00)    Total Protein: 7.8 Gm/dL (50/27/74 12:87:86)    Albumin Lvl: 3.4 Gm/dL (76/72/09 47:09:62)    Calcium Lvl: 8.3 mg/dL Low (83/66/29 47:65:46)    Magnesium Lvl: 2 mg/dL (50/35/46 56:81:27)    Bilirubin Total: 0.3 mg/dL (51/70/01 74:94:49)    Alkaline Phosphatase: 50 Units/L (06/30/20 16:13:00)    AST: 112 Units/L High (06/30/20 16:13:00)    ALT: 91 Units/L High (06/30/20 16:13:00)    Ferritin Lvl: 1130 nGm/ml High (06/30/20 16:13:00)    WBC: 3.3 thous/mm3 Low (06/30/20 16:13:00)    RBC: 5.65 Mil/mm3 High (06/30/20 16:13:00)    Hgb: 14.6 Gm/dL (67/59/16 38:46:65)    Hct: 44.9 % (06/30/20 16:13:00)    Platelet: 162  thous/mm3 (06/30/20 16:13:00)    MCV: 79.5 fL Low (  06/30/20 16:13:00)    MCH: 25.8 pGm Low (06/30/20 16:13:00)    MCHC: 32.5 Gm/dL (47/09/29 57:47:34)    RDW-SD: 38.1 fL (06/30/20 16:13:00)    MPV: 9.4 fL (06/30/20 16:13:00)    Absolute Neutro Count: 1.96 thous/mm3 (06/30/20 16:13:00)    Absolute Lymphs Count: 1.07 thous/mm3 (06/30/20 16:13:00)    Absolute Mono Count: 0.26 thous/mm3 (06/30/20 16:13:00)    Absolute Eos Count: 0 thous/mm3 (06/30/20 16:13:00)    Absolute Baso Count: 0.01 thous/mm3 (06/30/20 16:13:00)    Neutrophils: 59.4 % (06/30/20 16:13:00)    Lymphocytes: 32.4 % (06/30/20 16:13:00)    Monocytes: 7.9 % (06/30/20 16:13:00)    Eosinophils: 0 % (06/30/20 16:13:00)    Basophils: 0.3 % (06/30/20 16:13:00)    Immature Granulocytes: 0 % (06/30/20 16:13:00)    NRBC Percent: 0 % (06/30/20 16:13:00)    Absolute NRBC Count: 0 thous/mm3 (06/30/20 16:13:00)    D-Dimer Quant: 1.29 mg/L FEU High (06/30/20 16:13:00)    Blue Top Tube To Hold: DONE (06/30/20 16:13:00)        ------        [1] XR Chest Single View; McMahon MD, Colm 06/30/2020 16:44 EDT    SIGNATURE LINE Electronically signed by Ninfa Linden MD, Kaiyon Hynes on 07/01/2020 at  06:03:35 EST

## 2020-06-30 NOTE — Discharge Summary (Signed)
 Name :  Tiffany Leach, Tiffany Leach    DOB :  [id number]    Sex :  Female    MRN :  [id number]    Date of Admission    06/30/2020    Date of Discharge    8/89/2021    Admission History    This is a 39 year old female with no medical history ( as per the patient )  however as per PCP's Note history significant for paroxysmal atrial  fibrillation, alcoholism who comes to the er after being tested positive for  COVID 19.    -She says she started having symptoms of eye pain, photophobia since last week. She says she was having pain in her body and just not feeling well. She says she tested positive for COVID 19 on Thursday and is coming to the hospital because of shortness of breath and cough.    -Says she does not have smell and taste. Says she has not gotten vaccinated. Says her husband is also admitted in this hospital for COVID infection. [1]    Code Status    Code Status - Ordered    -- 06/30/20 21:10:00 EDT, Full Resuscitation, Constant Order    Allergies    Imitrex (breathing)    amoxicillin    penicillins    Social History    Smoking Status    Former smoker    Alcohol    Current, Liquor, Daily    Substance Use \- Denies Substance Abuse    Tobacco \- Denies Tobacco Use    Former smoker, Cigarettes    Hospital Course    39 year old female with a medical history of alcohol induced paroxysmal atrial  fibrillation, alcoholism is being admitted for        #Pneumonia due to COVID-19    -Symptoms of fever, cough, shortness of breath and no hypoxia    -Chest x-ray showing patchy bilateral airspace opacities concerning for early pneumonia.    -Elevated ferritin    -D-dimer 1.29    -PCT<0.6    CTA chest negative for PE        no hypoxemia O2 sat >96%    no evidence of PE or bacterial infection    no indications for decadron or remdesivir        #Hx of Paroxysmal atrial fibrillation    patient remained in NSR overnite        patient will be discharged on vitamin supplements    She has a pulse oximeter at home    She is to return to the ED  if oximetry <93%          Procedures and Treatment Provided    Physical Exam    Vitals & Measurements    **T: **99.1 ??F (Oral) **TMIN: **98.4 ??F (Oral) **TMAX: **100.4 ??F (Oral) **HR:  **90 **RR: **26 **BP: **115/74 **SpO2: **98% **WT: **84.4 Kg    General: Alert and oriented x 3    Eye: Pupils are equal    HENT: Oral mucosa is moist.    Neck: Supple, Non-tender,no neck rigidity    Respiratory: Respirations are non labored, Lungs are clear to auscultation    Cardiovascular: Normal rate, Regular rhythm, s1 s2 appreciated    Gastrointestinal: Soft, Non-tender, Non-distended.    Genitourinary: No costovertebral angle tenderness    Musculoskeletal Normal range of motion    Integumentary: Warm    Neurologic: Alert, Oriented x3, fluent speech, no facial asymmetry noted    grade 5/5 power in all  extremities, sensations intact. [2]    Lab Results    Glucose Lvl: 85 mg/dL (93/79/02 40:97:35)    BUN: 7 mg/dL (32/99/24 26:83:41)    Creatinine: 0.448 mg/dL Low (96/22/29 79:89:21)    Afn Amer Glomerular Filtration Rate: >90 (07/01/20 06:28:00)    Non-Afn Amer Glomerular Filtration Rate: >90 (07/01/20 06:28:00)    Sodium Lvl: 139 mmol/L (07/01/20 06:28:00)    Potassium Lvl: 3.5 mmol/L Low (07/01/20 06:28:00)    Chloride: 107 mmol/L (07/01/20 06:28:00)    CO2: 22 mmol/L (07/01/20 06:28:00)    Anion Gap: 10 (07/01/20 06:28:00)    Calcium Lvl: 8 mg/dL Low (19/41/74 08:14:48)    Procalcitonin Sepsis: <.06 (07/01/20 06:28:00)    WBC: 2.4 thous/mm3 Low (07/01/20 06:28:00)    RBC: 5.46 Mil/mm3 High (07/01/20 06:28:00)    Hgb: 13.9 Gm/dL (18/56/31 49:70:26)    Hct: 43.4 % (07/01/20 06:28:00)    Platelet: 140 thous/mm3 Low (07/01/20 06:28:00)    MCV: 79.5 fL Low (07/01/20 06:28:00)    MCH: 25.5 pGm Low (07/01/20 06:28:00)    MCHC: 32 Gm/dL (37/85/88 50:27:74)    RDW-SD: 38 fL (07/01/20 06:28:00)    MPV: 9.9 fL (07/01/20 06:28:00)    Absolute Neutro Count Manual: 1.14 thous/mm3 Low (07/01/20 06:28:00)    Absolute Lymphs Count Manual:  1.04 thous/mm3 (07/01/20 06:28:00)    Absolute Mono Count Manual: 0.24 thous/mm3 (07/01/20 06:28:00)    Absolute Eos Count Manual: 0 thous/mm3 (07/01/20 06:28:00)    Absolute Baso Count Manual: 0 thous/mm3 (07/01/20 06:28:00)    Neutrophils Manual: 47 % (07/01/20 06:28:00)    Lymphs Manual: 43 % (07/01/20 06:28:00)    Monos Manual: 10 % (07/01/20 06:28:00)    Eos Manual: 0 % (07/01/20 06:28:00)    Basos Manual: 0 % (07/01/20 06:28:00)    Reactive Lymphs Manual: 0 % (07/01/20 06:28:00)    NRBC Percent: 0 % (07/01/20 06:28:00)    Absolute NRBC Count: 0 thous/mm3 (07/01/20 06:28:00)    Smudge Cells: Occasional (07/01/20 06:28:00)    RBC Morphology: See Below Abnormal (07/01/20 06:28:00)    Anisocytosis: 1+ Abnormal (07/01/20 06:28:00)    Microcytes: 1+ Abnormal (07/01/20 06:28:00)    Discharge Diagnoses    COVID-19 associated pneumonia (Axis I diagnosis)    zinc sulfate, 220 mg = 1 cap(s), PO, Daily, # 30 cap(s), 0 Refill(s),  Pharmacy: [id number], Cap, 154.9, 07/01/20 0:26:00 EDT, Height, cm,  84.4, 07/01/20 0:26:00 EDT, Weight, Kg    Peripheral IV Removal    The Discharge Electronic Signature    Discharge Medications    _Discharge_    Alyacen 1/35 oral tablet, 1 tab(s), PO, Daily, TAKE 1 TABLET BY MOUTH EVERY  DAY    folic acid 1 mg oral tablet, 1 mg= 1 tab(s), PO, Daily    Multiple Vitamins with Minerals oral tablet, 1 tab(s), PO, Daily    Vitamin D3    Zinc-220 oral capsule, 220 mg= 1 cap(s), PO, Daily    Discharge Instructions    return to ED if oximetry <93% , chest pains and SOB    Face to Face        Face to face patient counseling coordinating care more than 50% of encounter  time : Yes    Total time encounter : more than 35 minutes    [1] Admission H & Precious Bard MD, [facility] 06/30/2020 19:13 EDT    [2] Admission H & Precious Bard MD, [facility] 06/30/2020 19:13 EDT    SIGNATURE LINE Electronically signed by Eppie Gibson MD,  Toney Lizaola on 07/01/2020 at  16:58:21 EST

## 2020-07-01 LAB — HX .MANUAL DIFF
CASE NUMBER: 2021221000345
HX ABSOLUTE BASO COUNT MANUAL: 0 10*3/uL — NL (ref 0.0–0.22)
HX ABSOLUTE EOS COUNT MANUAL: 0 10*3/uL — NL (ref 0.0–0.45)
HX ABSOLUTE LYMPHS COUNT MANUAL: 1.04 10*3/uL — NL (ref 0.74–5.04)
HX ABSOLUTE MONO COUNT MANUAL: 0.24 10*3/uL — NL (ref 0.0–1.34)
HX ABSOLUTE NEUTRO COUNT MANUAL: 1.14 10*3/uL — ABNORMAL LOW (ref 1.48–7.95)
HX BASOS MANUAL: 0 %
HX EOS MANUAL: 0 %
HX LYMPHS MANUAL: 43 %
HX MONOS MANUAL: 10 %
HX NEUTROPHILS MANUAL: 47 %
HX REACTIVE LYMPHS MANUAL: 0 %

## 2020-07-01 LAB — HX GLOMERULAR FILTRATION RATE (ESTIMATED)
CASE NUMBER: 2021221000345
HX AFN AMER GLOMERULAR FILTRATION RATE: >90
HX NON-AFN AMER GLOMERULAR FILTRATION RATE: >90

## 2020-07-01 LAB — HX CBC W/ DIFF
CASE NUMBER: 2021221000345
HX ABSOLUTE NRBC COUNT: 0 10*3/uL
HX HCT: 43.4 % — NL (ref 36.0–47.0)
HX HGB: 13.9 g/dL — NL (ref 11.8–16.0)
HX MCH: 25.5 pg — ABNORMAL LOW (ref 26.0–34.0)
HX MCHC: 32 g/dL — NL (ref 31.0–37.0)
HX MCV: 79.5 fL — ABNORMAL LOW (ref 80.0–100.0)
HX MPV: 9.9 fL — NL (ref 9.4–12.4)
HX NRBC PERCENT: 0 % — NL
HX PLATELET: 140 10*3/uL — ABNORMAL LOW (ref 150.0–400.0)
HX RBC: 5.46 10*6/uL — ABNORMAL HIGH (ref 3.9–5.2)
HX RDW-CV: 13.3 % — NL (ref 11.5–14.5)
HX RDW-SD: 38 fL — NL (ref 35.0–51.0)
HX WBC: 2.4 10*3/uL — ABNORMAL LOW (ref 3.7–11.2)

## 2020-07-01 LAB — HX BASIC METABOLIC PANEL
CASE NUMBER: 2021221000345
HX ANION GAP: 10 — NL (ref 3.0–11.0)
HX BUN: 7 mg/dL — NL (ref 6.0–20.0)
HX CALCIUM LVL: 8 mg/dL — ABNORMAL LOW (ref 8.5–10.5)
HX CHLORIDE: 107 mmol/L — NL (ref 98.0–110.0)
HX CO2: 22 mmol/L — NL (ref 21.0–32.0)
HX CREATININE: 0.448 mg/dL — ABNORMAL LOW (ref 0.55–1.3)
HX GLUCOSE LVL: 85 mg/dL — NL (ref 70.0–110.0)
HX POTASSIUM LVL: 3.5 mmol/L — ABNORMAL LOW (ref 3.6–5.2)
HX SODIUM LVL: 139 mmol/L — NL (ref 136.0–146.0)

## 2020-07-01 LAB — HX PROCALCITONIN: CASE NUMBER: 2021221000345

## 2020-08-13 ENCOUNTER — Ambulatory Visit: Admitting: Cardiovascular Disease

## 2020-08-13 NOTE — Progress Notes (Signed)
* * *      Bigbee, Brittley **DOB:** Jan 21, 1981 (39 yo F) **Acc No.** [id number] **DOS:**  08/13/2020    ---       Wynona Neat, Mitzi Davenport**    ------    74 Y old Female, DOB: [birth year]    [address], [address], [place], [place] [address]    Home: [phone]    Provider: Darlyn Chamber, MD        * * *    Telephone Encounter    ---    Answered by  Archer Asa Date: 08/13/2020       Time: 11:28 AM    Reason  14 days Biotel ( pt was seen at [facility] )    ------                * * *              * * *        ---      Reason for Appointment    ---     1\. 14 days Biotel ( pt was seen at [facility] )    ---     Assessments    ---    1\. Palpitations - R00.2 (Primary)    ---    2\. Atrial fibrillation, unspecified type - I48.91    ---     Impression/Recommendations    ---      **1\. Palpitations**    _IMAGING: **zBIOTEL PATCH MCOT EVENT MONITOR (30 days) REAL TIME (Ordered for  08/13/2020)_    ---         **2\. Atrial fibrillation, unspecified type**    _IMAGING: **zBIOTEL PATCH MCOT EVENT MONITOR (30 days) REAL TIME (Ordered for  08/13/2020)_          * * *        Provider: Darlyn Chamber, MD 08/13/2020    ---    Note generated by eClinicalWorks EMR/PM Software ([email or web address])

## 2020-08-15 ENCOUNTER — Ambulatory Visit

## 2020-08-15 NOTE — Progress Notes (Signed)
 Tiffany Leach, Tiffany Leach **DOB:** 1981/10/15 (39 yo F) **Acc No.** [id number] **DOS:**  08/15/2020    ---      **Progress Notes**    ---    **Patient:** Tiffany Leach     **Account Number:** [id number]  **Provider:** Medical Assistant     **DOB:** 05-02-1981 **Age:** 39 Y **Sex:** Female  **Date:** 08/15/2020     **Phone:** [phone]     **Address:** [address], [address], [place], [id number]     **Pcp:** MYTHILY MEDA, MD        * * *        **Subjective:**        ---      **Chief Complaints:**    ------        ------     **Medical History:**        ------        **Objective:**        ---         **Assessment:**        ---      **Assessment:**        1\. Palpitations - R00.2 (Primary)    2\. Atrial fibrillation, unspecified type - I48.91    ------        **Plan:**        ---       **1\. Palpitations**    _Imaging: **zBIOTEL PATCH MCOT EVENT MONITOR (30 days) REAL TIME_    ---     **2\. Atrial fibrillation, unspecified type**    _Imaging: **zBIOTEL PATCH MCOT EVENT MONITOR (30 days) REAL TIME_                ---    Electronically signed by Narda Bonds on 08/15/2020 at 10:27 AM EDT    Sign off status: Completed          * * *      **Provider:** Medical Assistant  **Date:** 08/15/2020    ------

## 2020-09-13 ENCOUNTER — Ambulatory Visit: Admitting: Cardiovascular Disease

## 2020-09-13 NOTE — Progress Notes (Signed)
* * *      Tiffany Leach, Tiffany Leach **DOB:** 27-Jan-1981 (39 yo F) **Acc No.** [id number] **DOS:**  09/13/2020    ---       Wynona Neat, Mitzi Davenport**    ------    72 Y old Female, DOB: 1981-03-11    [address], [address] [place], [place] [address]    Home: [phone]    Provider: Darlyn Chamber, MD        * * *    Telephone Encounter    ---    Answered by  Illene Bolus, Dillan Candela Date: 09/13/2020       Time: 04:04 PM    Reason  EVENT MONITOR    ------            Action Taken                     [facility] , MD 09/13/2020 4:09:01 PM > Please call: Event monitor was benign without any significant arrythmias with occasional skipped beats (very common) and is very reassuring. No change in regimen recommended                    * * *                ---          * * *        Provider: Darlyn Chamber, MD 09/13/2020    ---    Note generated by eClinicalWorks EMR/PM Software ([email or web address])

## 2021-02-18 NOTE — Progress Notes (Signed)
* * *         [facility]**        ---    Kieth Brightly. Donnetta Simpers, MD [facility] Barbera Setters Byrd Hesselbach, MD [facility];    Darlyn Chamber, MD [facility] Roosvelt Maser, MD [facility] Shanon Brow, MD [facility]  Nile Dear. Audley Hose, MD [facility]    Kandis Mannan A. Karie Mainland, MD [facility] Johnell Comings. MacNaught, MD [facility] Mitchel Honour, MD Erma Pinto, MD [facility]    Colletta Maryland, MD Weston Anna, MD [facility] Charlesetta Shanks, MD Kerby Moors, NP    Tonna Corner, NP Danelle Earthly, NP Caroleen Hamman, NP        * * *     **Patient Name:** [name]   **Date:** 07/07/2018    --- ---     **DOB:** 1980/12/10     **Referring Provider:** Charna Archer, MD   **Appointment Provider:** Darrell Jewel, NP        * * *    07/07/2018   **Appointment Provider:** Darrell Jewel, NP    --- ---      **Supervising Provider:** ERIC Francie Massing, MD    ---        Current Medications    ---    Taking     * Aspir 81 81 mg delayed release tablet 1 tab(s) orally once a day    ---    * folic acid 1 mg tablet 1 tab(s) orally once a day    ---    * multivitamin with minerals Multiple Vitamins with Minerals capsule 1 cap(s) orally once a day    ---    * thiamine 100 mg tablet 1 tab(s) orally once a day    ---    * verapamil 180 mg/24 hours capsule, extended release 1 cap(s) orally once a day    ---    * Fioricet 300 mg-50 mg-40 mg capsule 1 cap(s) orally every 4 hours    ---    * Medication List reviewed and reconciled with the patient    ---      Past Medical History    ---      Atrial fibrillation with RVR    ---    IBS, possibly Chrohn's    ---    Alcohol abuse    ---      Family History    ---      Paternal Grand Father: diagnosed with Cancer    ---    Maternal Grand Mother: Diabetes    ---    Paternal uncle: Cancer    ---    Maternal uncle: Cancer    ---      Social History    ---    Advised to lose weight: Yes .    no Tobacco Patient counseled on the dangers of tobacco use: 07/07/2018.    Alcohol: Previous significant alcohol use, reports complete  cessation since  hospitalization 05/2018.Marland Kitchen   Patient lives with her fiance and their 2 children.    She works as an Producer, television/film/video at [employer].    ---      Allergies    ---      penicillin    ---    sumatriptan    ---    Toprol-XL: malaise, migraines - Criticality Low    ---      Review of Systems    ---     _BC_ :    Fatigue No.  Weight gain No. Weight loss No. Weakness No. Fevers No. Chills No.  Shortness of breath No . Cough No . Dyspnea on exertion No. Wheezes No. Chest  pain No. Chest tightness No. Palpitations No. LE edema No. PND No. Orthopnea  No. Presyncope No. Syncope No. Lightheadedness Yes, mild, rare positional as  per HPI. Dizziness No. Falls No. Easy bleeding/bruising No. Nausea No.  Vomiting No. Abdominal pain No. Melena, black stools No. Joint pains No.  Claudication No.            Reason for Appointment    ---      1\. Hospital follow-up for new atrial fibrillation    ---      History of Present Illness    ---     _CP_ :    This is a very pleasant 40 year old female with history of inflammatory bowel  disease presumed to be Crohn's who is status post hospitalization for new  onset atrial fibrillation with rapid ventricular response in the setting of  excessive intake, caffeine, increased stress. Atrial fibrillation was  associated with pounding sensation in the chest, dizziness, nausea and  vomiting. She was chemically converted via diltiazem drip. Patient was  discharged on Toprol, baby aspirin. She was also initiated on Foley catheter,  thiamine and encouraged to abstain from alcohol.    Since hospital discharge, patient developed returned to the emergency  department several days following her discharge in the setting of migraine as  well as significant malaise, weakness. She was appears at that time. She  followed up with her PCP who changed her Toprol to verapamil in the setting of  significant malaise, weakness felt to be a side effect of Toprol regimen.    Patient has felt well  following transition to verapamil. She reports complete  abstinence from alcohol since her hospitalization, which she is commended on.  She also reports starting an exercise program and maintaining a well-balanced  diet. She does inquire about longevity of her medication regimen as she would  like to limit medications if possible.    Patient denies any recurrence of symptoms. She does report occasional, mild  positional lightheadedness which may be secondary to her new medication  regimen. She reports that her migraines have significantly improved.      Vital Signs    ---    HR 71, BP 116/84, Wt 173, Ht 61, BMI 32.68, Med Assist: MB.      Examination    ---     _HS_ :    General: well developed, obese female in NAD. Eyes PERRLA, Conjunctiva pale.  Neck: no JVD, bruits, or lymphadenopathy. Pulmonary System clear to  auscultation bilaterally. Cardiac: PMI is nondisplaced, normal S1 S2, no  murmur, gallop or pericardial rub. GI SYSTEM soft, NT/ND, BS present, no  masses, No bruit. Extremities no clubbing, no edema. Peripheral pulses: normal  (2+) bilaterally. Skin: normal, no rash. Neurologic exam: grossly non-focal.  Pyschiatric appropriate.          Data    ---     _EKG (reviewed personally)_ :    07/07/2018: NSR, HR 71.          Impression/Recommendations    ---       **1\. Paroxysmal atrial fibrillation**    _IMAGING: **EKG_    Clinical Notes:    s/p Hospitalization for new onset Atrial fibrillation with RVR. Patient was  initially chemically converted via diltiazem drip, discharge on baby aspirin  and beta blocker. Beta  blocker has since been transitioned to verapamil  therapy secondary to significant side effects to beta blocker.EKG today  demonstrates normal sinus rhythm, HR 71 BPM. Patient commended on alcohol  cessation, new exercise regimen. She reports compliance with medications,  continue current regimen at this time. Follow-up with Dr. Illene Bolus 9/24 as  arranged.    .    ---        **2\. Alcohol abuse,  uncomplicated**    Clinical Notes:    Currently taking Thiamine and Folic acid as prescribed. Patient reports  complete alcohol cessation, she is commended on this and encouraged to  continue at this time.    .        Diagnostic Imaging    --- ---    Ardine Bjork: WORK NOTE (Ordered for 07/07/2018)_    ---       Procedure Codes    ---      93[address] IH    ---      Follow Up    ---    6 Months w/ HS    **Appointment Provider:** Darrell Jewel, NP    Electronically signed by Arlis Porta MD on 07/07/2018 at 05:50 PM EDT    Sign off status: Completed        * * *        [facility]    [address]    [place], [place] [address]    Tel: [phone]    Fax: [phone]              * * *          Patient: MARQUERITE, FORSMAN DOB: 07-25-1981 Progress Note: Darrell Jewel, NP  07/07/2018    ---    Note generated by eClinicalWorks EMR/PM Software ([email or web address])

## 2021-04-29 ENCOUNTER — Other Ambulatory Visit (HOSPITAL_BASED_OUTPATIENT_CLINIC_OR_DEPARTMENT_OTHER): Admitting: Internal Medicine

## 2021-06-26 ENCOUNTER — Encounter

## 2021-06-27 ENCOUNTER — Ambulatory Visit: Payer: PRIVATE HEALTH INSURANCE | Attending: Internal Medicine | Primary: Internal Medicine

## 2021-08-05 ENCOUNTER — Other Ambulatory Visit (HOSPITAL_BASED_OUTPATIENT_CLINIC_OR_DEPARTMENT_OTHER): Admitting: Internal Medicine

## 2021-09-18 ENCOUNTER — Encounter (HOSPITAL_BASED_OUTPATIENT_CLINIC_OR_DEPARTMENT_OTHER)

## 2021-09-18 ENCOUNTER — Ambulatory Visit (HOSPITAL_BASED_OUTPATIENT_CLINIC_OR_DEPARTMENT_OTHER): Admitting: Anesthesiology

## 2021-09-18 ENCOUNTER — Encounter

## 2021-09-18 ENCOUNTER — Ambulatory Visit: Payer: PRIVATE HEALTH INSURANCE | Attending: Internal Medicine | Primary: Internal Medicine

## 2021-09-18 ENCOUNTER — Other Ambulatory Visit

## 2021-09-18 LAB — POCT PREGNANCY, URINE: POC hCG Qual, Ur: NEGATIVE

## 2021-09-18 MED ORDER — propofol (Diprivan) injection
10 | INTRAVENOUS | Status: DC | PRN
Start: 2021-09-18 — End: 2021-09-18
  Administered 2021-09-18: 12:00:00 50 via INTRAVENOUS
  Administered 2021-09-18: 12:00:00 30 via INTRAVENOUS
  Administered 2021-09-18: 12:00:00 100 via INTRAVENOUS

## 2021-09-18 MED ORDER — lactated Ringer's infusion
INTRAVENOUS | Status: DC
Start: 2021-09-18 — End: 2021-09-19
  Administered 2021-09-18: 12:00:00 50 mL/h via INTRAVENOUS

## 2021-09-18 MED ORDER — lidocaine PF (Xylocaine) 10 mg/mL (1 %) injection  - Omnicell Override Pull
10 | INTRAMUSCULAR | Status: AC
Start: 2021-09-18 — End: ?

## 2021-09-18 MED ORDER — lidocaine PF (Xylocaine) 20 mg/mL (2 %) injection
20 | INTRAMUSCULAR | Status: DC | PRN
Start: 2021-09-18 — End: 2021-09-18
  Administered 2021-09-18: 12:00:00 40 via INTRAVENOUS

## 2021-09-18 MED ORDER — propofol (Diprivan) 10 mg/mL injection  - Omnicell Override Pull
10 | INTRAVENOUS | Status: AC
Start: 2021-09-18 — End: ?

## 2021-09-18 MED FILL — LIDOCAINE (PF) 10 MG/ML (1 %) INJECTION SOLUTION: 10 10 mg/mL (1 %) | INTRAMUSCULAR | Qty: 4

## 2021-09-18 MED FILL — PROPOFOL 10 MG/ML INTRAVENOUS EMULSION: 10 10 mg/mL | INTRAVENOUS | Qty: 50

## 2021-09-18 NOTE — H&P (Addendum)
 GI Endoscopy Short H&P:    This is a 40 year old female with history of Crohn's in the colon and ileum who presents for routine colonoscopy. No symptoms and on no medication.      Previous history and physical reviewed.  Otherwise, no changes necessary.  We reviewed the risks of the procedure which include bleeding, infection, perforation, and death.  Patient understands, has signed consent, and is willing to proceed.    Medications  Medications      Start Medication Dose/Rate, Route, Frequency Ordered Stop    09/18/21 0800 lactated Ringer's infusion         50 mL/hr, IV, Continuous 09/18/21 0733 --                 Past Medical History   has a past medical history of Graves disease and Irritable bowel syndrome.    Allergies  Imitrex [sumatriptan] and Amoxicillin    Surgical History   has a past surgical history that includes Mouth surgery.     Family History  No family history on file.    Social History   reports that she has quit smoking. Her smoking use included cigarettes. She has never used smokeless tobacco. She reports that she does not currently use alcohol. She reports current drug use. Drug: Marijuana.    Vitals:  Temp: 36.7 C (98 F)  Pulse: 81  BP: 121/82  Resp: 16  SpO2: 99 %    Temp:  [36.7 C (98 F)] 36.7 C (98 F)  Pulse:  [81] 81  Resp:  [16] 16  BP: (121)/(82) 121/82     Physical Exam:  General: Alert and oriented, in no acute distress  HEENT: no scleral icterus  Resp: breathing comfortably on room air  Abdomen: soft, non-tender, non-distended  Ext: no edema    Lab Results:  Hospital Outpatient Visit on 09/18/2021   Component Date Value Ref Range Status   . Preg Test, Ur 09/18/2021 Negative   Final     Plan:  Colonoscopy

## 2021-09-18 NOTE — Other (Signed)
 Patient Education   Table of Contents       Colon Biopsy       Colonoscopy, Adult, Care After       Diverticulosis       Hemorrhoids       Monitored Anesthesia Care, Care After     To view videos and all your education online visit,   [email or web address]   or scan this QR code with your smartphone.                    Colon Biopsy     A colon biopsy is a procedure to remove tissue samples from the colon, which is part of the large intestine. The tissue that is removed can then be looked at under a microscope for disease.    The biopsy will be done during a colonoscopy. A colonoscopy involves inserting a colonoscope into the opening between the buttocks (anus) and then into the rectum, colon, and other parts of the large intestine. A colonoscope is a flexible tube that has oil or gel on it (is lubricated) and a camera on the end. The scope allows your health care provider to see the inside of your colon. A biopsy may be done to help screen for or diagnose medical problems. These include:       Tumors.       A type of abnormal growth called polyps.       Inflammation.       Areas of bleeding.     Tell a health care provider about:         Any allergies you have.       All medicines you are taking, including vitamins, herbs, eye drops, creams, and over-the-counter medicines.       Any problems you or family members have had with anesthetic medicines.       Any blood disorders you have.       Any surgeries you have had.       Any medical conditions you have.       Whether you are pregnant or may be pregnant.     What are the risks?    Generally, this is a safe procedure. However, problems may occur, including:       Infection.       Bleeding.       Allergic reactions to medicines.       Damage to nearby structures or organs.       A hole (perforation) in the colon that must be fixed with surgery.     What happens before the procedure?   Eating and drinking restrictions    Follow instructions from your health  care provider about eating and drinking, which may include:       Several days before the procedure - follow a low-fiber diet. Avoid nuts, seeds, dried fruit, raw fruits, and vegetables.       1?3 days before the procedure - follow a clear liquid diet. This diet is limited to liquids or certain soft or frozen foods that you can see through. These liquids or foods include clear broth or bouillon, black coffee or plain tea, clear fruit juice, clear soft drinks or sports drinks, gelatin dessert, and flavored ice. Avoid any liquids, gelatins, or frozen items that contain red or purple dye.       On the day of the procedure - do not eat or drink anything during the 2 hours before the procedure,  or within the time period that your health care provider recommends.     Bowel prep    If you were prescribed an oral bowel prep to clean out your colon:       Take it as told by your health care provider. Starting the day before your procedure, you will need to drink a large amount of medicated liquid. The liquid will cause you to have multiple loose bowel movements until your stool (feces) is almost clear or light green.      If your skin or the anus gets irritated from diarrhea, you may use these to relieve the irritation:       Medicated wipes, such as adult wet wipes with aloe and vitamin E.       A skin-soothing product such as petroleum jelly.     If you vomit while drinking the bowel prep, take a break for up to 60 minutes and then begin the bowel prep again. Call your health care provider if you continue to vomit and cannot take the bowel prep without vomiting.   Medicines    Ask your health care provider about:       Changing or stopping your regular medicines. This is especially important if you are taking diabetes medicines, arthritis medicines, or blood thinners.       Taking medicines such as aspirin and ibuprofen. These medicines can thin your blood. Do not  take these medicines unless your health care provider  tells you to take them.       Taking over-the-counter medicines, vitamins, herbs, and supplements.       When to stop iron therapy given by mouth or IV before the colonoscopy and biopsy (usually 4?5 days before).     General instructions         Plan to have a responsible adult take you home from the hospital or clinic.       Plan to have a responsible adult care for you for the time you are told after you leave the hospital or clinic. This is important.       Ask your health care provider how your biopsy site will be marked.       Ask your health care provider what steps will be taken to prevent infection.       Ask your health care provider for what time period you should avoid tobacco use, including chewing tobacco.     What happens during the procedure?            You will be asked to lie on your side with your knees bent toward your chest.       An IV will be inserted into one of your veins.       You will be given a medicine to help you relax (sedative).       Your health care provider will lubricate the colonoscope.       The colonoscope will be gently eased through the rectum and moved to the area of abnormal tissue.       Air will be delivered into the colon to keep it open. You may feel some pressure or cramping.       The camera on the colonoscope may be used to take images during the procedure.       Surgical instruments will be inserted through the colonoscope to perform the biopsy.       One or more tissue samples will be clipped from  your colon. The sample or samples will be sent to the lab for testing.     The procedure may vary among health care providers and hospitals.     What happens after the procedure?         Your blood pressure, heart rate, breathing rate, and blood oxygen level will be monitored until you leave the hospital or clinic.       You may have cramping, bloating, and gas in your abdomen.       There may be some bleeding from the anus.       If you were given a sedative during the  procedure, it can affect you for several hours. Do not  drive or operate machinery until your health care provider says that it is safe.       It is up to you to get the results of your procedure. Ask your health care provider, or the department that is doing the procedure, when your results will be ready.     Summary         A colon biopsy is a procedure to remove tissue samples from the colon, which is part of the large intestine. The tissue that is removed can then be looked at under a microscope for disease.       Before the procedure, take the oral bowel prep as told by your health care provider to clean out your colon, if the prep was prescribed for you.       Plan to have a responsible adult take you home from the hospital or clinic.       Plan to have a responsible adult care for you for the time you are told after you leave the hospital or clinic. This is important.     This information is not intended to replace advice given to you by your health care provider. Make sure you discuss any questions you have with your health care provider.     Document Released: 05/29/2018Document Revised: 04/07/2021Document Reviewed: 02/28/2020     Elsevier Patient Education ? 2022 [employer]         Colonoscopy, Adult, Care After     After a colonoscopy, it is common to have:       A small amount of blood in your poop (stool) for 24 hours.       Some gas.       Mild cramping or bloating in your belly (abdomen).     Follow these instructions at home:   Your doctor may give you more instructions. If you have problems, contact your doctor.   Eating and drinking            Drink enough fluid to keep your pee (urine) pale yellow.       Follow instructions from your doctor about what you cannot eat or drink.       Return to your normal diet as told by your doctor. Avoid heavy or fried foods that are hard to digest.     Activity         Rest as told by your doctor.       Get up to take short walks every 1 to 2 hours. Ask for  help if you feel weak or unsteady.       Return to your normal activities when your doctor says that it is safe.     To help cramping and bloating:  Try walking around.      If told, put heat on your belly. Do this as told by your doctor. Use the heat source that your doctor recommends, such as a moist heat pack or a heating pad.       Place a towel between your skin and the heat source.       Leave the heat on for 20?30 minutes.       Take off the heat if your skin turns bright red. This is very important. If you cannot feel pain, heat, or cold, you have a greater risk of getting burned.       General instructions         If you were given a sedative during your procedure, do not drive or use machines until your doctor says that it is safe. A sedative is a medicine that helps you relax.      For the first 24 hours after the procedure:      Do not  sign important documents.      Do not  drink alcohol.       Do your daily activities more slowly than normal.       Eat foods that are soft and easy to digest.       Take over-the-counter and prescription medicines only as told by your doctor.       Keep all follow-up visits.       Contact a doctor if:         You have blood in your poop 2?3 days after the procedure.     Get help right away if:         You have more than a small amount of blood in your poop.       You see large clumps of tissue (blood clots) in your poop.       Your belly is swollen.       You feel like you may vomit (nauseous).       You vomit.       You have a fever.       You have belly pain that gets worse, and medicine does not help your pain.     These symptoms may be an emergency. Get help right away. Call 911.      Do not wait to see if the symptoms will go away.      Do not drive yourself to the hospital.     Summary         After a colonoscopy, it is common to have a small amount of blood in your poop. You may also have mild cramping and bloating in your belly.       If you were given  a sedative during your procedure, do not drive or use machines until your doctor says that it is safe. A sedative is a medicine that helps you relax.       Get help right away if you have a lot of blood in your poop, feel like you may vomit, have a fever, or have more belly pain.     This information is not intended to replace advice given to you by your health care provider. Make sure you discuss any questions you have with your health care provider.     Document Released: 01/20/2012Document Revised: 08/10/2022Document Reviewed: 07/02/2021     Elsevier Patient Education ? 2022 [employer]         Diverticulosis  Diverticulosis is a condition that develops when small pouches (diverticula) form in the wall of the large intestine (colon). The colon is where water is absorbed and stool (feces) is formed. The pouches form when the inside layer of the colon pushes through weak spots in the outer layers of the colon. You may have a few pouches or many of them.   The pouches usually do not cause problems unless they become inflamed or infected. When this happens, the condition is called diverticulitis.     What are the causes?   The cause of this condition is not known.   What increases the risk?    The following factors may make you more likely to develop this condition:       Being older than age 46. Your risk for this condition increases with age. Diverticulosis is rare among people younger than age 69. By age 61, many people have it.       Eating a low-fiber diet.       Having frequent constipation.       Being overweight.       Not getting enough exercise.       Smoking.       Taking over-the-counter pain medicines, like aspirin and ibuprofen.       Having a family history of diverticulosis.     What are the signs or symptoms?    In most people, there are no symptoms of this condition. If you do have symptoms, they may include:       Bloating.       Cramps in the abdomen.       Constipation or diarrhea.        Pain in the lower left side of the abdomen.     How is this diagnosed?    Because diverticulosis usually has no symptoms, it is most often diagnosed during an exam for other colon problems. The condition may be diagnosed by:       Using a flexible scope to examine the colon (colonoscopy).       Taking an X-ray of the colon after dye has been put into the colon (barium enema).       Having a CT scan.     How is this treated?       You may not need treatment for this condition. Your health care provider may recommend treatment to prevent problems. You may need treatment if you have symptoms or if you previously had diverticulitis. Treatment may include:       Eating a high-fiber diet.       Taking a fiber supplement.       Taking a live bacteria supplement (probiotic).       Taking medicine to relax your colon.       Follow these instructions at home:   Medicines         Take over-the-counter and prescription medicines only as told by your health care provider.       If told by your health care provider, take a fiber supplement or probiotic.     Constipation prevention       Your condition may cause constipation. To prevent or treat constipation, you may need to:       Drink enough fluid to keep your urine pale yellow.       Take over-the-counter or prescription medicines.       Eat foods that are high in fiber, such as beans, whole  grains, and fresh fruits and vegetables.       Limit foods that are high in fat and processed sugars, such as fried or sweet foods.     General instructions         Try not to strain when you have a bowel movement.       Keep all follow-up visits as told by your health care provider. This is important.       Contact a health care provider if you:         Have pain in your abdomen.       Have bloating.       Have cramps.       Have not had a bowel movement in 3 days.     Get help right away if:         Your pain gets worse.       Your bloating becomes very bad.       You have a fever or  chills, and your symptoms suddenly get worse.       You vomit.       You have bowel movements that are bloody or black.       You have bleeding from your rectum.     Summary         Diverticulosis is a condition that develops when small pouches (diverticula) form in the wall of the large intestine (colon).       You may have a few pouches or many of them.       This condition is most often diagnosed during an exam for other colon problems.       Treatment may include increasing the fiber in your diet, taking supplements, or taking medicines.     This information is not intended to replace advice given to you by your health care provider. Make sure you discuss any questions you have with your health care provider.     Document Released: 09/14/2005Document Revised: 07/16/2020Document Reviewed: 06/08/2019     Elsevier Patient Education ? 2022 [employer]         Hemorrhoids         Hemorrhoids are swollen veins that may develop:       In the butt (rectum). These are called internal hemorrhoids.       Around the opening of the butt (anus). These are called external hemorrhoids.     Hemorrhoids can cause pain, itching, or bleeding. Most of the time, they do not cause serious problems. They usually get better with diet changes, lifestyle changes, and other home treatments.     What are the causes?    This condition may be caused by:       Having trouble pooping (constipation).       Pushing hard (straining) to poop.       Watery poop (diarrhea).       Pregnancy.       Being very overweight (obese).       Sitting for long periods of time.       Heavy lifting or other activity that causes you to strain.       Anal sex.       Riding a bike for a long period of time.     What are the signs or symptoms?    Symptoms of this condition include:       Pain.       Itching or soreness in the butt.  Bleeding from the butt.       Leaking poop.       Swelling in the area.       One or more lumps around the opening of your butt.      How is this diagnosed?    A doctor can often diagnose this condition by looking at the affected area. The doctor may also:       Do an exam that involves feeling the area with a gloved hand (digital rectal exam).       Examine the area inside your butt using a small tube (anoscope).       Order blood tests. This may be done if you have lost a lot of blood.       Have you get a test that involves looking inside the colon using a flexible tube with a camera on the end (sigmoidoscopy or colonoscopy).     How is this treated?    This condition can usually be treated at home. Your doctor may tell you to change what you eat, make lifestyle changes, or try home treatments. If these do not help, procedures can be done to remove the hemorrhoids or make them smaller. These may involve:       Placing rubber bands at the base of the hemorrhoids to cut off their blood supply.       Injecting medicine into the hemorrhoids to shrink them.       Shining a type of light energy onto the hemorrhoids to cause them to fall off.       Doing surgery to remove the hemorrhoids or cut off their blood supply.     Follow these instructions at home:   Eating and drinking            Eat foods that have a lot of fiber in them. These include whole grains, beans, nuts, fruits, and vegetables.       Ask your doctor about taking products that have added fiber (fibersupplements).      Reduce the amount of fat in your diet. You can do this by:       Eating low-fat dairy products.       Eating less red meat.       Avoiding processed foods.       Drink enough fluid to keep your pee (urine) pale yellow.     Managing pain and swelling            Take a warm-water bath (sitz bath) for 20 minutes to ease pain. Do this 3?4 times a day. You may do this in a bathtub or using a portable sitz bath that fits over the toilet.      If told, put ice on the painful area. It may be helpful to use ice between your warm baths.       Put ice in a plastic bag.        Place a towel between your skin and the bag.       Leave the ice on for 20 minutes, 2?3 times a day.       General instructions        Take over-the-counter and prescription medicines only as told by your doctor.       Medicated creams and medicines may be used as told.       Exercise often. Ask your doctor how much and what kind of exercise is best for you.  Go to the bathroom when you have the urge to poop. Do not  wait.       Avoid pushing too hard when you poop.       Keep your butt dry and clean. Use wet toilet paper or moist towelettes after pooping.      Do not  sit on the toilet for a long time.       Keep all follow-up visits as told by your doctor. This is important.       Contact a doctor if you:         Have pain and swelling that do not get better with treatment or medicine.       Have trouble pooping.       Cannot poop.       Have pain or swelling outside the area of the hemorrhoids.     Get help right away if you have:         Bleeding that will not stop.     Summary         Hemorrhoids are swollen veins in the butt or around the opening of the butt.       They can cause pain, itching, or bleeding.       Eat foods that have a lot of fiber in them. These include whole grains, beans, nuts, fruits, and vegetables.       Take a warm-water bath (sitz bath) for 20 minutes to ease pain. Do this 3?4 times a day.     This information is not intended to replace advice given to you by your health care provider. Make sure you discuss any questions you have with your health care provider.     Document Released: 09/26/2009Document Revised: 06/29/2022Document Reviewed: 05/21/2021     Elsevier Patient Education ? 2022 [employer]         Monitored Anesthesia Care, Care After     This sheet gives you information about how to care for yourself after your procedure. Your health care provider may also give you more specific instructions. If you have problems or questions, contact your health care provider.   What  can I expect after the procedure?    After the procedure, it is common to have:       Tiredness.       Forgetfulness about what happened after the procedure.       Impaired judgment for important decisions.       Nausea or vomiting.       Some difficulty with balance.     Follow these instructions at home:   For the time period you were told by your health care provider:               Rest as needed.      Do not  participate in activities where you could fall or become injured.      Do not  drive or use machinery.      Do not  drink alcohol.      Do not  take sleeping pills or medicines that cause drowsiness.      Do not  make important decisions or sign legal documents.      Do not  take care of children on your own.       Eating and drinking         Follow the diet that is recommended by your health care provider.  Drink enough fluid to keep your urine pale yellow.      If you vomit:       Drink water, juice, or soup when you can drink without vomiting.       Make sure you have little or no nausea before eating solid foods.     General instructions         Have a responsible adult stay with you for the time you are told. It is important to have someone help care for you until you are awake and alert.       Take over-the-counter and prescription medicines only as told by your health care provider.      If you have sleep apnea, surgery and certain medicines can increase your risk for breathing problems. Follow instructions from your health care provider about wearing your sleep device:       Anytime you are sleeping, including during daytime naps.       While taking prescription pain medicines, sleeping medicines, or medicines that make you drowsy.       Avoid smoking.       Keep all follow-up visits as told by your health care provider. This is important.       Contact a health care provider if:         You keep feeling nauseous or you keep vomiting.       You feel light-headed.       You are still sleepy or  having trouble with balance after 24 hours.       You develop a rash.       You have a fever.       You have redness or swelling around the IV site.     Get help right away if:         You have trouble breathing.       You have new-onset confusion at home.     Summary         For several hours after your procedure, you may feel tired. You may also be forgetful and have poor judgment.       Have a responsible adult stay with you for the time you are told. It is important to have someone help care for you until you are awake and alert.       Rest as told. Do not  drive or operate machinery. Do not  drink alcohol or take sleeping pills.       Get help right away if you have trouble breathing, or if you suddenly become confused.     This information is not intended to replace advice given to you by your health care provider. Make sure you discuss any questions you have with your health care provider.     Document Released: 04/09/2017Document Revised: 09/02/2021Document Reviewed: 10/12/2019     Elsevier Patient Education ? 2022 [employer]

## 2021-09-18 NOTE — Anesthesia Post-Procedure Evaluation (Signed)
 Patient: Tiffany Leach    Procedure Summary     Date: 09/18/21 Room / Location: [facility]    Anesthesia Start: [phone] Anesthesia Stop: [phone]    Procedure: COLONOSCOPY Diagnosis:       History of Crohn's disease      (Crohn's disease of the small bowel and colon)      (Follow-up of Crohn's disease of the small bowel and colon)      (High risk colon cancer surveillance: Crohn's colitis of 8 (or more) years duration with one-third (or more) of the colon involved)    Scheduled Providers: Caralee Ates, MD; Vickki Hearing, MD Responsible Provider: Vickki Hearing, MD    Anesthesia Type: MAC ASA Status: 2          Anesthesia Type: MAC    Vitals Value Taken Time   BP 102/68 09/18/21 0855   Temp 97 09/18/21 1102   Pulse 72 09/18/21 0855   Resp 20 09/18/21 0855   SpO2 100 % 09/18/21 0855       Anesthesia Post Evaluation Note:    Patient location during evaluation: End[facility] Recovery  Patient participation: able to participate  Level of consciousness: awake  Cardiovascular and Hydration status: vital signs within acceptable range  Respiratory Status Stable and Airway Patent: yes       Aldrete score reviewed: yes  Vitals reviewed: yes  Unplanned ICU Admission: noPatient has recovered from anesthesia and has returned to baseline mental status, cardiovascular and respiratory function. Pain, nausea, and vomiting are adequately controlled and the patient is adequately hydrated and appropriate for discharge from PACU?: yes      There were no known notable events for this encounter.

## 2021-09-18 NOTE — Nursing Note (Signed)
 Post Diverticulosis, Internal hemorrhoids  Segmental Random bx - h/o crohns

## 2021-09-18 NOTE — Anesthesia Pre-Procedure Evaluation (Addendum)
 Patient: Tiffany Leach    Procedure Information     Date/Time: 09/18/21 0800    Scheduled providers: Caralee Ates, MD; Vickki Hearing, MD    Procedure: COLONOSCOPY    Location: [facility]          Relevant Problems   No relevant active problems       Clinical information reviewed:   Tobacco  Allergies  Meds   Med Hx  Surg Hx  OB Status  Fam Hx  Soc   Hx         Physical Exam    Airway  Mallampati: II  TM distance: >3 FB  Mouth opening: >3 FB       Cardiovascular - normal exam  Functional capacity: greater than or equal to 4 METS without symptoms   Dental - normal exam     Pulmonary - normal exam     Abdominal    General   Alert                 Anesthesia Plan    ASA 2   NPO status verified    MAC     Airway: natural airway  Monitoring: standard monitors    Essential imaging and labs available and reviewed    Anesthetic plan and risks discussed with patient.

## 2021-09-19 LAB — TISSUE PATHOLOGY

## 2021-12-15 ENCOUNTER — Encounter (HOSPITAL_BASED_OUTPATIENT_CLINIC_OR_DEPARTMENT_OTHER): Admitting: Internal Medicine

## 2021-12-17 ENCOUNTER — Other Ambulatory Visit

## 2021-12-25 ENCOUNTER — Encounter (HOSPITAL_BASED_OUTPATIENT_CLINIC_OR_DEPARTMENT_OTHER): Admitting: Gastroenterology

## 2021-12-26 ENCOUNTER — Encounter (HOSPITAL_BASED_OUTPATIENT_CLINIC_OR_DEPARTMENT_OTHER)

## 2021-12-26 ENCOUNTER — Ambulatory Visit: Admit: 2021-12-26 | Discharge: 2021-12-26 | Payer: PRIVATE HEALTH INSURANCE | Primary: Internal Medicine

## 2021-12-26 ENCOUNTER — Other Ambulatory Visit

## 2021-12-26 ENCOUNTER — Encounter (HOSPITAL_BASED_OUTPATIENT_CLINIC_OR_DEPARTMENT_OTHER): Admitting: Internal Medicine

## 2021-12-26 DIAGNOSIS — K51919 Ulcerative colitis, unspecified with unspecified complications: Secondary | ICD-10-CM

## 2021-12-26 DIAGNOSIS — K51918 Ulcerative colitis, unspecified with other complication: Secondary | ICD-10-CM

## 2021-12-26 MED ORDER — sodium chloride 0.9 % flush 10 mL
Freq: Once | INTRAMUSCULAR | Status: DC | PRN
Start: 2021-12-26 — End: 2021-12-26

## 2021-12-26 MED ORDER — sodium chloride 0.9 % flush 10 mL
Freq: Once | INTRAMUSCULAR | Status: DC
Start: 2021-12-26 — End: 2021-12-26

## 2021-12-26 MED ORDER — inFLIXimab (Remicade) 400 mg in sodium chloride 0.9 % 250 mL IVPB
100 | Freq: Once | INTRAVENOUS | Status: AC
Start: 2021-12-26 — End: 2021-12-26
  Administered 2021-12-26: 14:00:00 via INTRAVENOUS

## 2021-12-26 MED FILL — IVPB BUILDER: 100 mg | INTRAVENOUS | Qty: 40

## 2021-12-26 NOTE — Progress Notes (Signed)
Tiffany Leach comes to [facility]  w/ Husband for #1 induction dose of Remicade. Infusion protocol followed.TB test Neg. 10/30/21 Patient tol well, Patient stayed observation period. No adverse reaction. IV removed, DSD applied. Patient left [facility] ambulating independ. W/ Husband. Patient aware of next appt

## 2021-12-31 ENCOUNTER — Encounter (HOSPITAL_BASED_OUTPATIENT_CLINIC_OR_DEPARTMENT_OTHER): Admitting: Gastroenterology

## 2021-12-31 ENCOUNTER — Other Ambulatory Visit

## 2022-01-09 ENCOUNTER — Other Ambulatory Visit

## 2022-01-09 ENCOUNTER — Ambulatory Visit: Admit: 2022-01-09 | Discharge: 2022-01-09 | Payer: PRIVATE HEALTH INSURANCE | Primary: Internal Medicine

## 2022-01-09 DIAGNOSIS — K51919 Ulcerative colitis, unspecified with unspecified complications: Secondary | ICD-10-CM

## 2022-01-09 DIAGNOSIS — K51918 Ulcerative colitis, unspecified with other complication: Secondary | ICD-10-CM

## 2022-01-09 MED ORDER — sodium chloride 0.9 % flush 10 mL
Freq: Once | INTRAMUSCULAR | Status: DC | PRN
Start: 2022-01-09 — End: 2022-01-09

## 2022-01-09 MED ORDER — inFLIXimab (Remicade) 400 mg in sodium chloride 0.9 % 250 mL IVPB
0.9 | Freq: Once | INTRAVENOUS | Status: AC
Start: 2022-01-09 — End: 2022-01-09
  Administered 2022-01-09: 14:00:00 via INTRAVENOUS

## 2022-01-09 MED ORDER — sodium chloride 0.9 % flush 10 mL
Freq: Once | INTRAMUSCULAR | Status: DC
Start: 2022-01-09 — End: 2022-01-09

## 2022-01-09 MED FILL — IVPB BUILDER: 100 mg | INTRAVENOUS | Qty: 40

## 2022-01-09 NOTE — Progress Notes (Signed)
Twisha cam to the clinic for remicade 400mg  iv over induction dose 2 per protocol. Tol well. Observed for 30 min post. She has her next apt.

## 2022-02-06 ENCOUNTER — Ambulatory Visit: Admit: 2022-02-06 | Discharge: 2022-02-06 | Payer: PRIVATE HEALTH INSURANCE | Primary: Internal Medicine

## 2022-02-06 ENCOUNTER — Encounter (HOSPITAL_BASED_OUTPATIENT_CLINIC_OR_DEPARTMENT_OTHER): Admitting: Gastroenterology

## 2022-02-06 ENCOUNTER — Other Ambulatory Visit

## 2022-02-06 ENCOUNTER — Encounter (HOSPITAL_BASED_OUTPATIENT_CLINIC_OR_DEPARTMENT_OTHER)

## 2022-02-06 DIAGNOSIS — K51919 Ulcerative colitis, unspecified with unspecified complications: Secondary | ICD-10-CM

## 2022-02-06 MED ORDER — inFLIXimab (Remicade) 400 mg in sodium chloride 0.9 % 250 mL IVPB
100 | Freq: Once | INTRAVENOUS | Status: AC
Start: 2022-02-06 — End: 2022-02-06
  Administered 2022-02-06: 13:00:00 via INTRAVENOUS

## 2022-02-06 MED ORDER — sodium chloride 0.9 % flush 10 mL
Freq: Once | INTRAMUSCULAR | Status: AC
Start: 2022-02-06 — End: 2022-02-06
  Administered 2022-02-06: 13:00:00 10 mL via INTRAVENOUS

## 2022-02-06 MED ORDER — sodium chloride 0.9 % flush 10 mL
Freq: Once | INTRAMUSCULAR | Status: DC | PRN
Start: 2022-02-06 — End: 2022-02-06
  Administered 2022-02-06: 15:00:00 10 mL via INTRAVENOUS

## 2022-02-06 MED FILL — IVPB BUILDER: 100 mg | INTRAVENOUS | Qty: 40

## 2022-02-06 NOTE — Progress Notes (Signed)
Patient here at [facility] for Remicade 400mg  iv infusion to run per induction dose protocol.    INDUCTION DOSE #3 TODAY    Hep B- 10/30/2021 and TB-10/30/2021   (see lab results scanned in media)    No labs ordered.    Peripheral IV inserted, flushed with NS, blood return noted and tolerated well.     IV infusion administered, tolerated well, no adverse reactions noted.    At discharge, Peripheral IV flushed with normal saline, PIV discontinued and covered with a sterile gauze.      Patient aware of next appointment.     Patient discharged from [facility] in stable condition ambulating with upright steady gait.

## 2022-03-11 ENCOUNTER — Other Ambulatory Visit: Admit: 2022-03-11 | Payer: MEDICAID | Primary: Internal Medicine

## 2022-03-11 DIAGNOSIS — K523 Indeterminate colitis: Secondary | ICD-10-CM

## 2022-03-11 LAB — CBC WITH DIFFERENTIAL
Basophils %: 0.6 %
Basophils Absolute: 0.06 10*3/uL (ref 0.00–0.22)
Eosinophils %: 0.9 %
Eosinophils Absolute: 0.09 10*3/uL (ref 0.00–0.50)
Hematocrit: 43.7 % (ref 32.0–47.0)
Hemoglobin: 13.6 g/dL (ref 11.0–16.0)
Immature Granulocytes %: 0.2 %
Immature Granulocytes Absolute: 0.02 10*3/uL (ref 0.00–0.10)
Lymphocyte %: 36 %
Lymphocytes Absolute: 3.74 10*3/uL (ref 0.70–4.00)
MCH: 25.9 pg — ABNORMAL LOW (ref 26.0–34.0)
MCHC: 31.1 g/dL (ref 31.0–37.0)
MCV: 83.2 fL (ref 80.0–100.0)
MPV: 9.1 fL (ref 9.1–12.4)
Monocytes %: 5.5 %
Monocytes Absolute: 0.57 10*3/uL (ref 0.36–0.77)
NRBC %: 0 % (ref 0.0–0.0)
NRBC Absolute: 0 10*3/uL (ref 0.00–2.00)
Neutrophil %: 56.8 %
Neutrophils Absolute: 5.9 10*3/uL (ref 1.50–7.95)
Platelets: 351 10*3/uL (ref 150–400)
RBC: 5.25 M/uL — ABNORMAL HIGH (ref 3.70–5.20)
RDW-CV: 15.3 % — ABNORMAL HIGH (ref 11.5–14.5)
RDW-SD: 46.5 fL (ref 35.0–51.0)
WBC: 10.4 10*3/uL (ref 4.0–11.0)

## 2022-03-11 LAB — COMPREHENSIVE METABOLIC PANEL
ALT: 19 U/L (ref 0–55)
AST: 10 U/L (ref 6–42)
Albumin: 3.8 g/dL (ref 3.2–5.0)
Alkaline phosphatase: 100 U/L (ref 30–130)
Anion Gap: 1 mmol/L — ABNORMAL LOW (ref 3–14)
BUN: 15 mg/dL (ref 6–24)
Bilirubin, total: 0.3 mg/dL (ref 0.2–1.2)
CO2 (Bicarbonate): 27 mmol/L (ref 20–32)
Calcium: 9.3 mg/dL (ref 8.5–10.5)
Chloride: 109 mmol/L (ref 98–110)
Creatinine: 0.69 mg/dL (ref 0.55–1.30)
Glucose: 99 mg/dL (ref 70–110)
Potassium: 3.9 mmol/L (ref 3.6–5.2)
Protein, total: 7.4 g/dL (ref 6.0–8.4)
Sodium: 137 mmol/L (ref 135–146)
eGFRcr: 114 mL/min/{1.73_m2} (ref >=60)

## 2022-03-11 LAB — IRON, FERRITIN, TIBC
Ferritin: 44.8 ng/mL (ref 10.0–307.0)
Iron Saturation: 10 % (ref 10–50)
Iron: 34 ug/dL (ref 30–180)
TIBC: 355 ug/dL (ref 250–463)

## 2022-03-11 LAB — C-REACTIVE PROTEIN: CRP: <0.29 mg/dL (ref 0.29–0.80)

## 2022-03-11 LAB — LIPASE: Lipase: 23 U/L (ref 13–75)

## 2022-03-14 ENCOUNTER — Ambulatory Visit: Admit: 2022-03-14 | Payer: MEDICAID | Primary: Internal Medicine

## 2022-03-14 DIAGNOSIS — K523 Indeterminate colitis: Secondary | ICD-10-CM

## 2022-03-21 LAB — CALPROTECTIN, STOOL: Calprotectin, Stool: 8 ug/g

## 2022-03-27 ENCOUNTER — Ambulatory Visit: Payer: MEDICAID | Attending: Internal Medicine | Primary: Internal Medicine

## 2022-04-03 ENCOUNTER — Encounter: Payer: MEDICAID | Primary: Internal Medicine

## 2022-04-03 ENCOUNTER — Encounter

## 2022-04-10 ENCOUNTER — Emergency Department: Admit: 2022-04-10 | Payer: MEDICAID | Primary: Internal Medicine

## 2022-04-10 ENCOUNTER — Inpatient Hospital Stay
Admit: 2022-04-10 | Discharge: 2022-04-11 | Disposition: A | Discharge status: Home / Self Care | Payer: MEDICAID | Attending: Emergency Medicine

## 2022-04-10 DIAGNOSIS — S2232XA Fracture of one rib, left side, initial encounter for closed fracture: Secondary | ICD-10-CM

## 2022-04-10 DIAGNOSIS — R079 Chest pain, unspecified: Secondary | ICD-10-CM

## 2022-04-10 LAB — CBC WITH DIFFERENTIAL
Basophils %: 0.5 %
Basophils Absolute: 0.05 10*3/uL (ref 0.00–0.22)
Eosinophils %: 0.7 %
Eosinophils Absolute: 0.07 10*3/uL (ref 0.00–0.50)
Hematocrit: 42.8 % (ref 32.0–47.0)
Hemoglobin: 14.1 g/dL (ref 11.0–16.0)
Immature Granulocytes %: 0.2 %
Immature Granulocytes Absolute: 0.02 10*3/uL (ref 0.00–0.10)
Lymphocyte %: 42.6 %
Lymphocytes Absolute: 4.2 10*3/uL — ABNORMAL HIGH (ref 0.70–4.00)
MCH: 27.1 pg (ref 26.0–34.0)
MCHC: 32.9 g/dL (ref 31.0–37.0)
MCV: 82.3 fL (ref 80.0–100.0)
MPV: 9.1 fL (ref 9.1–12.4)
Monocytes %: 5.8 %
Monocytes Absolute: 0.57 10*3/uL (ref 0.36–0.77)
NRBC %: 0 % (ref 0.0–0.0)
NRBC Absolute: 0 10*3/uL (ref 0.00–2.00)
Neutrophil %: 50.2 %
Neutrophils Absolute: 4.94 10*3/uL (ref 1.50–7.95)
Platelets: 331 10*3/uL (ref 150–400)
RBC: 5.2 M/uL (ref 3.70–5.20)
RDW-CV: 13.9 % (ref 11.5–14.5)
RDW-SD: 41.3 fL (ref 35.0–51.0)
WBC: 9.9 10*3/uL (ref 4.0–11.0)

## 2022-04-10 LAB — COMPREHENSIVE METABOLIC PANEL
ALT: 28 U/L (ref 0–55)
AST: 19 U/L (ref 6–42)
Albumin: 3.8 g/dL (ref 3.2–5.0)
Alkaline phosphatase: 69 U/L (ref 30–130)
Anion Gap: 5 mmol/L (ref 3–14)
BUN: 10 mg/dL (ref 6–24)
Bilirubin, total: 0.5 mg/dL (ref 0.2–1.2)
CO2 (Bicarbonate): 23 mmol/L (ref 20–32)
Calcium: 10 mg/dL (ref 8.5–10.5)
Chloride: 109 mmol/L (ref 98–110)
Creatinine: 0.66 mg/dL (ref 0.55–1.30)
Glucose: 86 mg/dL (ref 70–110)
Potassium: 3.4 mmol/L — ABNORMAL LOW (ref 3.6–5.2)
Protein, total: 7.6 g/dL (ref 6.0–8.4)
Sodium: 137 mmol/L (ref 135–146)
eGFRcr: 115 mL/min/{1.73_m2} (ref >=60)

## 2022-04-10 LAB — LIGHT BLUE TOP

## 2022-04-10 LAB — RAINBOW DRAW SST GOLD TOP

## 2022-04-10 LAB — TROPONIN I, HIGH SENSITIVITY: Troponin I, High Sensitivity: <6 ng/L

## 2022-04-10 MED ORDER — sodium chloride 0.9 % bolus 1,000 mL
0.9 | Freq: Once | INTRAVENOUS | Status: AC
Start: 2022-04-10 — End: 2022-04-10
  Administered 2022-04-10: 1000 mL via INTRAVENOUS

## 2022-04-10 MED ORDER — acetaminophen (Tylenol) tablet 975 mg
325 | Freq: Once | ORAL | Status: AC
Start: 2022-04-10 — End: 2022-04-10
  Administered 2022-04-10: 975 mg via ORAL

## 2022-04-10 MED ORDER — iohexol (OMNIPaque) 350 mg iodine/mL solution 85 mL
350 | Freq: Once | INTRAVENOUS | Status: AC
Start: 2022-04-10 — End: 2022-04-10
  Administered 2022-04-10: 85 mL via INTRAVENOUS

## 2022-04-10 MED ORDER — ketorolac (Toradol) injection 30 mg
30 | Freq: Once | INTRAMUSCULAR | Status: AC
Start: 2022-04-10 — End: 2022-04-10
  Administered 2022-04-10: 30 mg via INTRAVENOUS

## 2022-04-10 MED FILL — KETOROLAC 30 MG/ML (1 ML) INJECTION SOLUTION: 30 30 mg/mL (1 mL) | INTRAMUSCULAR | Qty: 1

## 2022-04-10 MED FILL — ACETAMINOPHEN 325 MG TABLET: 325 325 mg | ORAL | Qty: 3

## 2022-04-10 NOTE — ED Notes (Signed)
Updated pt on wait time for MD.      Lucianne Lei, RN  04/10/22 1645

## 2022-04-10 NOTE — ED Provider Notes (Signed)
HPI   Chief Complaint   Patient presents with   . Chest Pain     Pt had d and c procedure done yesterday d/t fetal demise at [redacted] weeks pregnant, and now is experiencing right sided chest pain and right upper back pain.  Pt states she is also coughing and experiencing fatigue and some SOB on exertion.  Sating well in triage.         41 year old G6, P2 AB 4 female who is 1 day S/P D&C for fetal demise at 6 weeks presents with right-sided chest pain radiating through to her back.  Pain is sharp.  Some respiratory component to it as well.  No fever chills or sweats no cough or congestion.                    Glasgow Coma Scale Score: 15                                  Patient History   Past Medical History:   Diagnosis Date   . Graves disease    . Irritable bowel syndrome      Past Surgical History:   Procedure Laterality Date   . MOUTH SURGERY       No family history on file.  Social History     Tobacco Use   . Smoking status: Former     Types: Cigarettes   . Smokeless tobacco: Never   Vaping Use   . Vaping status: Never Used   Substance Use Topics   . Alcohol use: Not Currently   . Drug use: Yes     Types: Marijuana       Review of Systems   Review of Systems   Constitutional: Positive for activity change. Negative for appetite change, chills, diaphoresis, fatigue and fever.   HENT: Negative for drooling, ear discharge, facial swelling, hearing loss, sore throat, trouble swallowing and voice change.    Eyes: Negative for pain, discharge and visual disturbance.   Respiratory: Negative for cough, chest tightness, shortness of breath and wheezing.    Cardiovascular: Positive for chest pain. Negative for palpitations and leg swelling.   Gastrointestinal: Negative for abdominal pain, constipation, diarrhea, nausea and vomiting.   Endocrine: Negative for cold intolerance.   Genitourinary: Negative for difficulty urinating, dysuria and flank pain.   Musculoskeletal: Negative for back pain, gait problem, joint swelling,  myalgias, neck pain and neck stiffness.   Skin: Negative for pallor and rash.   Neurological: Negative for dizziness, facial asymmetry, speech difficulty, light-headedness and headaches.   Psychiatric/Behavioral: Negative for confusion and decreased concentration. The patient is not nervous/anxious.        Physical Exam   ED Triage Vitals [04/10/22 1205]   Temp Pulse Resp BP   37 C (98.6 F) 103 20 124/82      SpO2 Temp Source Heart Rate Source Patient Position   97 % Oral -- --      BP Location FiO2 (%)     -- --       Physical Exam  Vitals and nursing note reviewed.   Constitutional:       General: She is not in acute distress.     Appearance: She is well-developed and normal weight. She is not ill-appearing, toxic-appearing or diaphoretic.   HENT:      Head: Normocephalic and atraumatic.      Right Ear: External  ear normal.      Left Ear: External ear normal.      Nose: Nose normal.      Mouth/Throat:      Mouth: Mucous membranes are moist.      Pharynx: Oropharynx is clear.   Eyes:      Extraocular Movements: Extraocular movements intact.      Pupils: Pupils are equal, round, and reactive to light.   Cardiovascular:      Rate and Rhythm: Normal rate and regular rhythm.      Pulses: Normal pulses.      Heart sounds: Normal heart sounds.   Pulmonary:      Effort: Pulmonary effort is normal. No respiratory distress.      Breath sounds: Normal breath sounds. No stridor. No wheezing, rhonchi or rales.   Chest:      Chest wall: No tenderness.   Abdominal:      General: Bowel sounds are normal. There is no distension.      Palpations: Abdomen is soft.      Tenderness: There is no abdominal tenderness.   Musculoskeletal:         General: Normal range of motion.      Cervical back: Normal range of motion and neck supple.   Skin:     General: Skin is warm and dry.   Neurological:      General: No focal deficit present.      Mental Status: She is alert and oriented to person, place, and time.      Cranial Nerves: No  cranial nerve deficit.   Psychiatric:         Mood and Affect: Mood normal.         Behavior: Behavior normal.         Thought Content: Thought content normal.         Judgment: Judgment normal.       XR CHEST 2 VIEWS   Final Result   No acute pulmonary abnormality.      Alexi Otrakji 04/10/2022 3:47 PM          Labs Reviewed   COMPREHENSIVE METABOLIC PANEL - Abnormal       Result Value    Sodium 137      Potassium 3.4 (*)     Chloride 109      CO2 (Bicarbonate) 23      Anion Gap 5      BUN 10      Creatinine 0.66      eGFRcr 115      Glucose 86      Fasting? Unknown      Calcium 10.0      AST 19      ALT 28      Alkaline phosphatase 69      Protein, total 7.6      Albumin 3.8      Bilirubin, total 0.5     CBC WITH DIFFERENTIAL - Abnormal    WBC 9.9      RBC 5.20      Hemoglobin 14.1      Hematocrit 42.8      MCV 82.3      MCH 27.1      MCHC 32.9      RDW-CV 13.9      RDW-SD 41.3      Platelets 331      MPV 9.1      Neutrophil % 50.2      Lymphocyte %  42.6      Monocytes % 5.8      Eosinophils % 0.7      Basophils % 0.5      Immature Granulocytes % 0.2      NRBC % 0.0      Neutrophils Absolute 4.94      Lymphocytes Absolute 4.20 (*)     Monocytes Absolute 0.57      Eosinophils Absolute 0.07      Basophils Absolute 0.05      Immature Granulocytes Absolute 0.02      NRBC Absolute 0.00     TROPONIN I, HIGH SENSITIVITY - Normal    Troponin I, High Sensitivity <6      Narrative:     Clinical correlation, HEART Score and shared decision making must be taken into account.                   For Chest Pain >3 Hours    Rule-Out Criteria              Single Value     0hr/1hr Delta Value  Female  <10ng/L*          <54ng/L AND delta <15ng/L  Female    <10ng/L*          <79ng/L AND delta <15ng/L    Cannot Rule-Out                            0hr/1hr Delta Value  Female    <54ng/L AND delta >15ng/L    >/= 54 AND delta </=15ng/L  Female      <79ng/L AND delta >15ng/L    >/= 79 AND delta </=15ng/L    Rule-In Criteria               Single  Value   0hr/1hr Delta Value                 Female   >115ng/L       >/=54ng/L AND delta >/=15ng/L         Female     >115ng/L       >/=79ng/L AND delta >/=15ng/L           *Note: for Chest pain <3 hours 0hr/1hr is warranted for evaluation.     CBC W/DIFF    Narrative:     The following orders were created for panel order CBC and differential.  Procedure                               Abnormality         Status                     ---------                               -----------         ------                     CBC w/ Differential[[id number]]          Abnormal            Final result                 Please view results for these tests on the individual orders.  POCT PREGNANCY, URINE       ED Course & MDM   ED Course as of 04/11/22 0357   Fri Apr 10, 2022   1806 EKG: NSR at 95.  Axis +11.  No ectopy or ischemia.  Normal EKG.  No significant change from previous EKG of June 30, 2020.   2041 Patient informed of the CT findings showing no pulmonary embolus but a left seventh rib fracture.  She then says "I thought I did that." I asked  when and she said a week or 2 ago she had terrible coughing fits/spasms and had left-sided chest pain.  She saw physical therapy and they massaged it out.  Now she believes she probably cracked a rib then..  With respect to her right-sided chest pain I do not have a diagnosis but we would just treat it symptomatically as there is no blood clot or sign of pneumonia.         Diagnoses as of 04/11/22 0357   Acute chest pain   Closed fracture of one rib of left side, initial encounter       Medical Decision Making  41 year old female 1 day status post D&C for fetal demise presents with right-sided chest pain radiating into her back.  There is some pleuritic component to her discomfort as well.  Differential includes a pulmonary embolus,  musculoskeletal chest pain,  Pneumonitis  Given her recent history I felt the CTA of the chest would best to determine whether she had suffered a pulmonary  embolus.  CTA of the chest was obtained and was negative but did show a left-sided rib fracture which she believes she sustained during vigorous.  coughing last week or so.    Patient will be discharged home on symptomatic treatment.    Acute chest pain: acute illness or injury  Closed fracture of one rib of left side, initial encounter: acute illness or injury  Amount and/or Complexity of Data Reviewed  Labs: ordered.  Radiology: ordered.      Risk  OTC drugs.  Prescription drug management.                Halford Chessman, MD  04/11/22 0400

## 2022-04-10 NOTE — Other (Signed)
Patient Education   Table of Contents     Nonspecific Chest Pain, Adult     Rib Fracture     To view videos and all your education online visit,   [email or web address]   or scan this QR code with your smartphone.   Access to this content will expire in one year.                    Nonspecific Chest Pain, Adult     Chest pain is an uncomfortable, tight, or painful feeling in the chest. The pain can feel like a crushing, aching, or squeezing pressure. A person can feel a burning or tingling sensation. Chest pain can also be felt in your back, neck, jaw, shoulder, or arm. This pain can be worse when you move, sneeze, or take a deep breath.   Chest pain can be caused by a condition that is life-threatening. This must be treated right away. It can also be caused by something that is not life-threatening. If you have chest pain, it can be hard to know the difference, so it is important to get help right away to make sure that you do not have a serious condition.    Some life-threatening causes of chest pain include:     Heart attack.     A tear in the body's main blood vessel (aortic dissection).     Inflammation around your heart (pericarditis).     A problem in the lungs, such as a blood clot (pulmonary embolism) or a collapsed lung (pneumothorax).      Some non life-threatening causes of chest pain include:     Heartburn.     Anxiety or stress.     Damage to the bones, muscles, and cartilage that make up your chest wall.     Pneumonia or bronchitis.     Shingles infection (varicella-zoster virus).     Your chest pain may come and go. It may also be constant. Your health care provider will do tests and other studies to find the cause of your pain. Treatment will depend on the cause of your chest pain.   Follow these instructions at home:   Medicines     Take over-the-counter and prescription medicines only as told by your health care provider.     If you were prescribed an antibiotic medicine, take it as told  by your health care provider. Do not  stop taking the antibiotic even if you start to feel better.     Activity     Avoid any activities that cause chest pain.    Do not  lift anything that is heavier than 10 lb (4.5 kg), or the limit that you are told, until your health care provider says that it is safe.     Rest as directed by your health care provider.     Return to your normal activities only as told by your health care provider. Ask your health care provider what activities are safe for you.     Lifestyle          Do not  use any products that contain nicotine or tobacco, such as cigarettes, e-cigarettes, and chewing tobacco. If you need help quitting, ask your health care provider.    Do not  drink alcohol.    Make healthy lifestyle changes as recommended. These may include:     Getting regular exercise. Ask your health care provider to suggest some exercises that are safe  for you.     Eating a heart-healthy diet. This includes plenty of fresh fruits and vegetables, whole grains, low-fat (lean) protein, and low-fat dairy products. A dietitian can help you find healthy eating options.     Maintaining a healthy weight.     Managing any other health conditions you may have, such as high blood pressure (hypertension) or diabetes.     Reducing stress, such as with yoga or relaxation techniques.       General instructions     Pay attention to any changes in your symptoms.     It is up to you to get the results of any tests that were done. Ask your health care provider, or the department that is doing the tests, when your results will be ready.     Keep all follow-up visits as told by your health care provider. This is important.     You may be asked to go for further testing if your chest pain does not go away.       Contact a health care provider if:       Your chest pain does not go away.     You feel depressed.     You have a fever.     You notice changes in your symptoms or develop new symptoms.     Get help  right away if:       Your chest pain gets worse.     You have a cough that gets worse, or you cough up blood.     You have severe pain in your abdomen.     You faint.     You have sudden, unexplained chest discomfort.     You have sudden, unexplained discomfort in your arms, back, neck, or jaw.     You have shortness of breath at any time.     You suddenly start to sweat, or your skin gets clammy.     You feel nausea or you vomit.     You suddenly feel lightheaded or dizzy.     You have severe weakness, or unexplained weakness or fatigue.     Your heart begins to beat quickly, or it feels like it is skipping beats.     These symptoms may represent a serious problem that is an emergency. Do not wait to see if the symptoms will go away. Get medical help right away. Call your local emergency services (911 in the [place] Do not drive yourself to the hospital.   Summary       Chest pain can be caused by a condition that is serious and requires urgent treatment. It may also be caused by something that is not life-threatening.     Your health care provider may do lab tests and other studies to find the cause of your pain.     Follow your health care provider's instructions on taking medicines, making lifestyle changes, and getting emergency treatment if symptoms become worse.     Keep all follow-up visits as told by your health care provider. This includes visits for any further testing if your chest pain does not go away.     This information is not intended to replace advice given to you by your health care provider. Make sure you discuss any questions you have with your health care provider.     Document Released: 09/27/2006Document Revised: 03/03/2022Document Reviewed: 01/23/2021     Elsevier Patient Education ? 2023 [employer]  Rib Fracture        A rib fracture is a break or crack in one of the bones of the ribs. The ribs are long, curved bones that wrap around your chest and attach to your spine and your  breastbone. The ribs protect your heart, lungs, and other organs in the chest.   A broken or cracked rib is often painful but is not usually serious. Most rib fractures heal on their own over time. However, rib fractures can be more serious if multiple ribs are broken or if broken ribs move out of place and push against other structures or organs.     What are the causes?    This condition is caused by:     Repetitive movements with high force, such as pitching a baseball or having very bad coughing spells.     A direct hit the chest, such as a sports injury, a car crash, or a fall.     Cancer that has spread to the bones, which can weaken bones and cause them to break.     What are the signs or symptoms?    Symptoms of this condition include:     Pain when you breathe in or cough.     Pain when someone presses on the injured area.     Feeling short of breath.     How is this diagnosed?    This condition is diagnosed with a physical exam and medical history. You may also have imaging tests, such as:     Chest X-ray.     CT scan.     MRI.     Bone scan.     Chest ultrasound.     How is this treated?   Treatment for this condition depends on the severity of the fracture. Most rib fractures usually heal on their own in 1?3 months. Healing may take longer if you have a cough or if you do activities that make the injury worse. While you heal, you may be given medicines to control the pain. You will also be taught deep breathing exercises.   Severe injuries may require hospitalization or surgery.   Follow these instructions at home:   Managing pain, stiffness, and swelling    If directed, put ice on the injured area. To do this:     Put ice in a plastic bag.     Place a towel between your skin and the bag.     Leave the ice on for 20 minutes, 2?3 times a day.     Remove the ice if your skin turns bright red. This is very important. If you cannot feel pain, heat, or cold, you have a greater risk of damage to the area.      Take over-the-counter and prescription medicines only as told by your health care provider.     Activity     Avoid doing activities or movements that cause pain. Be careful during activities and avoid bumping the injured rib.     Slowly increase your activity as told by your health care provider.     General instructions    Do deep breathing exercises as told by your health care provider. This helps prevent pneumonia, which is a common complication of a broken rib. Your health care provider may instruct you to:     Take deep breaths several times a day.     Try to cough several times a day, holding a pillow against  the injured area.     Use a device called incentive spirometer to practice deep breathing several times a day.     Drink enough fluid to keep your urine pale yellow.    Do not  wear a rib belt or binder. These restrict breathing, which can lead to pneumonia.     Keep all follow-up visits. This is important.     Contact a health care provider if:       You have a fever.     Get help right away if:       You have difficulty breathing or you are short of breath.     You develop a cough that does not stop, or you cough up thick or bloody sputum.     You have nausea, vomiting, or pain in your abdomen.     Your pain gets worse and medicine does not help.     These symptoms may represent a serious problem that is an emergency. Do not wait to see if the symptoms will go away. Get medical help right away. Call your local emergency services (911 in the [place] Do not drive yourself to the hospital.   Summary       A rib fracture is a break or crack in one of the bones of the ribs.     A broken or cracked rib is often painful but is not usually serious.     Most rib fractures heal on their own over time.     Treatment for this condition depends on the severity of the fracture.     Avoid doing any activities or movements that cause pain.     This information is not intended to replace advice given to you by your  health care provider. Make sure you discuss any questions you have with your health care provider.     Document Released: 12/18/2006Document Revised: 04/09/2021Document Reviewed: 03/01/2020     Elsevier Patient Education ? 2023 Elsevier Inc.

## 2022-04-17 ENCOUNTER — Ambulatory Visit: Admit: 2022-04-17 | Discharge: 2022-04-17 | Payer: MEDICAID | Primary: Internal Medicine

## 2022-04-17 DIAGNOSIS — K51819 Other ulcerative colitis with unspecified complications: Secondary | ICD-10-CM

## 2022-04-17 DIAGNOSIS — K51919 Ulcerative colitis, unspecified with unspecified complications: Secondary | ICD-10-CM

## 2022-04-17 MED ORDER — sodium chloride 0.9 % flush 10 mL
Freq: Once | INTRAMUSCULAR | Status: DC
Start: 2022-04-17 — End: 2022-04-17

## 2022-04-17 MED ORDER — inFLIXimab (Remicade) 400 mg in sodium chloride 0.9 % 250 mL IVPB
0.9 | Freq: Once | INTRAVENOUS | Status: AC
Start: 2022-04-17 — End: 2022-04-17
  Administered 2022-04-17: 14:00:00 400 mg via INTRAVENOUS

## 2022-04-17 MED ORDER — sodium chloride 0.9 % flush 10 mL
Freq: Once | INTRAMUSCULAR | Status: DC | PRN
Start: 2022-04-17 — End: 2022-04-17

## 2022-04-17 MED FILL — IVPB BUILDER: 100 400.0000 mg | INTRAVENOUS | Qty: 40

## 2022-04-17 NOTE — Progress Notes (Signed)
Patient here at [facility] for Remicade400mg  ivinfusionto run per induction dose protocol.      Hep B- 12/8/2022andTB-10/30/2021   (see lab results scanned in media)    No labs ordered.    Peripheral IV inserted, flushed with NS, blood return noted and tolerated well.    IV infusionadministered, tolerated well, no adverse reactions noted.    At discharge,Peripheral IV flushedwith normal saline, PIV discontinued and covered with a sterile gauze.    Patient aware of next appointment.    Patient discharged from [facility] in stable condition ambulating with upright steady gait.

## 2022-05-03 ENCOUNTER — Inpatient Hospital Stay: Payer: MEDICAID | Primary: Internal Medicine

## 2022-05-16 ENCOUNTER — Inpatient Hospital Stay
Admit: 2022-05-16 | Discharge: 2022-05-16 | Disposition: A | Discharge status: Home / Self Care | Payer: MEDICAID | Attending: Emergency Medicine

## 2022-05-16 DIAGNOSIS — R1084 Generalized abdominal pain: Secondary | ICD-10-CM

## 2022-05-16 LAB — BASIC METABOLIC PANEL
Anion Gap: 11 mmol/L (ref 3–14)
BUN: 16 mg/dL (ref 6–24)
CO2 (Bicarbonate): 23 mmol/L (ref 20–32)
Calcium: 9.4 mg/dL (ref 8.5–10.5)
Chloride: 105 mmol/L (ref 98–110)
Creatinine: 0.95 mg/dL (ref 0.55–1.30)
Glucose: 94 mg/dL (ref 70–139)
Potassium: 4 mmol/L (ref 3.6–5.2)
Sodium: 139 mmol/L (ref 135–146)
eGFRcr: 79 mL/min/{1.73_m2} (ref >=60)

## 2022-05-16 LAB — HEPATIC FUNCTION PANEL
ALT: 15 U/L (ref 0–55)
AST: 22 U/L (ref 6–42)
Albumin: 4.3 g/dL (ref 3.2–5.0)
Alkaline phosphatase: 81 U/L (ref 30–130)
Bilirubin, direct: <0.2 mg/dL (ref 0.0–0.5)
Bilirubin, total: <0.2 mg/dL — ABNORMAL LOW (ref 0.2–1.2)
Protein, total: 7.4 g/dL (ref 6.0–8.4)

## 2022-05-16 LAB — CBC WITH DIFFERENTIAL
Basophils %: 0.5 %
Basophils Absolute: 0.06 10*3/uL (ref 0.00–0.22)
Eosinophils %: 1.9 %
Eosinophils Absolute: 0.24 10*3/uL (ref 0.00–0.50)
Hematocrit: 43.5 % (ref 32.0–47.0)
Hemoglobin: 14.3 g/dL (ref 11.0–16.0)
Immature Granulocytes %: 0.2 %
Immature Granulocytes Absolute: 0.02 10*3/uL (ref 0.00–0.10)
Lymphocyte %: 43.1 %
Lymphocytes Absolute: 5.37 10*3/uL — ABNORMAL HIGH (ref 0.70–4.00)
MCH: 27.2 pg (ref 26.0–34.0)
MCHC: 32.9 g/dL (ref 31.0–37.0)
MCV: 82.7 fL (ref 80.0–100.0)
MPV: 9.4 fL (ref 9.1–12.4)
Monocytes %: 6.6 %
Monocytes Absolute: 0.82 10*3/uL — ABNORMAL HIGH (ref 0.36–0.77)
NRBC %: 0 % (ref 0.0–0.0)
NRBC Absolute: 0 10*3/uL (ref 0.00–2.00)
Neutrophil %: 47.7 %
Neutrophils Absolute: 5.95 10*3/uL (ref 1.50–7.95)
Platelets: 346 10*3/uL (ref 150–400)
RBC: 5.26 M/uL — ABNORMAL HIGH (ref 3.70–5.20)
RDW-CV: 13.1 % (ref 11.5–14.5)
RDW-SD: 39.4 fL (ref 35.0–51.0)
WBC: 12.5 10*3/uL — ABNORMAL HIGH (ref 4.0–11.0)

## 2022-05-16 LAB — BLOOD BANK HOLD TUBE

## 2022-05-16 LAB — URINALYSIS REFLEX TO CULTURE
Bacteria, Ur: NEGATIVE
Bilirubin, Ur: NEGATIVE
Glucose, Ur: NEGATIVE
Hyaline Casts, Ur: 0 /LPF (ref 0–8)
Ketones, Ur: NEGATIVE
Leukocyte Esterase, Ur: NEGATIVE
Nitrite, Ur: NEGATIVE
Protein, Ur: NEGATIVE
RBC, Ur: 4 {cells}/[HPF] (ref 0–4)
Specific Gravity, Ur: 1.026 (ref 1.001–1.035)
Squamous Epithelial Cells, Ur: NEGATIVE /HPF
Urobilinogen, Ur: 0.2 EU
WBC, Ur: 3 {cells}/[HPF] (ref 0–5)
pH, Ur: 5.5

## 2022-05-16 LAB — RAINBOW DRAW LT. GREEN PST TOP

## 2022-05-16 LAB — RBC MORPHOLOGY

## 2022-05-16 LAB — LIPASE: Lipase: 26 U/L (ref 8–60)

## 2022-05-16 LAB — LAVENDER TOP

## 2022-05-16 LAB — LIGHT BLUE TOP

## 2022-05-16 LAB — HCG, QUANTITATIVE, PREGNANCY: hCG, serum, quantitative: 1 m[IU]/mL (ref <=5)

## 2022-05-16 LAB — GRAY TOP

## 2022-05-16 MED ORDER — lactated Ringer's bolus 1,000 mL
Freq: Once | INTRAVENOUS | Status: AC
Start: 2022-05-16 — End: 2022-05-16
  Administered 2022-05-16: 08:00:00 1000 mL via INTRAVENOUS

## 2022-05-16 MED ORDER — ondansetron ODT (Zofran-ODT) 4 mg disintegrating tablet
4 | ORAL_TABLET | Freq: Three times a day (TID) | ORAL | 0 refills | Status: AC | PRN
Start: 2022-05-16 — End: 2022-05-23

## 2022-05-16 MED ORDER — ondansetron ODT (Zofran-ODT) disintegrating tablet 4 mg
4 | Freq: Once | ORAL | Status: AC
Start: 2022-05-16 — End: 2022-05-16
  Administered 2022-05-16: 10:00:00 4 mg via ORAL

## 2022-05-16 MED FILL — ONDANSETRON 4 MG DISINTEGRATING TABLET: 4 4 mg | ORAL | Qty: 1

## 2022-05-16 NOTE — ED Provider Notes (Signed)
HPI   Chief Complaint   Patient presents with   . Abdominal Pain       The patient is a 41 year old female who presents to the emergency department with abdominal pain.  The patient notes she has had vomiting for a few months as well as intermittent abdominal pain.  She has been evaluated by gastroenterology who is evaluating her for possible ulcerative colitis versus Crohn's disease.  They are also evaluating her for possible biliary colic.  She is due to have an ultrasound last week but it was rescheduled to this coming Tuesday.  Around 8 or 9 PM on 05/15/2022, the patient developed upper abdominal pain and had 1 episode of diarrhea.  She did not take any medications prior to arrival in the emergency department.  She has not had fevers or chills.  She notes she had ongoing emesis for months.      History provided by:  Patient                Glasgow Coma Scale Score: 15                                  Patient History   Past Medical History:   Diagnosis Date   . Graves disease    . Irritable bowel syndrome      Past Surgical History:   Procedure Laterality Date   . MOUTH SURGERY       No family history on file.  Social History     Tobacco Use   . Smoking status: Former     Types: Cigarettes   . Smokeless tobacco: Never   Vaping Use   . Vaping Use: Never used   Substance Use Topics   . Alcohol use: Not Currently   . Drug use: Yes     Types: Marijuana       Review of Systems   Review of Systems   Constitutional:        As noted in HPI       Physical Exam   ED Triage Vitals [05/16/22 0343]   Temp Pulse Resp BP   36.4 C (97.6 F) 107 18 129/85      SpO2 Temp Source Heart Rate Source Patient Position   97 % Oral -- --      BP Location FiO2 (%)     -- --       Physical Exam  Vitals and nursing note reviewed.   Constitutional:       General: She is not in acute distress.     Appearance: She is well-developed.   HENT:      Head: Normocephalic and atraumatic.   Eyes:      Conjunctiva/sclera: Conjunctivae normal.    Cardiovascular:      Rate and Rhythm: Normal rate and regular rhythm.      Heart sounds: No murmur heard.  Pulmonary:      Effort: Pulmonary effort is normal. No respiratory distress.      Breath sounds: Normal breath sounds.   Abdominal:      Palpations: Abdomen is soft.      Tenderness: There is abdominal tenderness in the epigastric area. There is no rebound. Negative signs include Murphy's sign and McBurney's sign.   Musculoskeletal:         General: No swelling.      Cervical back: Neck supple.   Skin:  General: Skin is warm and dry.      Capillary Refill: Capillary refill takes less than 2 seconds.   Neurological:      General: No focal deficit present.      Mental Status: She is alert and oriented to person, place, and time.   Psychiatric:         Mood and Affect: Mood normal.       No orders to display       Labs Reviewed   URINALYSIS REFLEX TO CULTURE - Abnormal       Result Value    Color, Ur Yellow      Clarity, Ur Clear      Specific Gravity, Ur 1.026      pH, Ur 5.5      Protein, Ur Negative      Glucose, Ur Negative      Ketones, Ur Negative      Blood, Ur Trace (*)     Bilirubin, Ur Negative      Urobilinogen, Ur 0.2      Nitrite, Ur Negative      Leukocyte Esterase, Ur Negative      WBC, Ur 3      RBC, Ur 4      Squamous Epithelial Cells, Ur Negative      Bacteria, Ur Negative      Hyaline Casts, Ur 0.0     HEPATIC FUNCTION PANEL - Abnormal    Albumin 4.3      Bilirubin, total <0.2 (*)     Bilirubin, direct <0.2      Alkaline phosphatase 81      AST 22      ALT 15      Protein, total 7.4     CBC WITH DIFFERENTIAL - Abnormal    WBC 12.5 (*)     RBC 5.26 (*)     Hemoglobin 14.3      Hematocrit 43.5      MCV 82.7      MCH 27.2      MCHC 32.9      RDW-CV 13.1      RDW-SD 39.4      Platelets 346      MPV 9.4      Neutrophil % 47.7      Lymphocyte % 43.1      Monocytes % 6.6      Eosinophils % 1.9      Basophils % 0.5      Immature Granulocytes % 0.2      NRBC % 0.0      Neutrophils Absolute 5.95       Lymphocytes Absolute 5.37 (*)     Monocytes Absolute 0.82 (*)     Eosinophils Absolute 0.24      Basophils Absolute 0.06      Immature Granulocytes Absolute 0.02      NRBC Absolute 0.00     RBC MORPHOLOGY - Abnormal    RBC Morphology See Indices (*)     Schistocytes 1+ (*)     Vacuolated Neutrophils Present (*)    HCG, QUANTITATIVE, PREGNANCY - Normal    hCG, serum, quantitative 1      Narrative:     Expected Results:  Adult Female: 0-1 mIU/mL  Non-pregnant Female: 0-4 mIU/mL    If pregnancy is expected, suggest repeat in 24-72 hours.    The concentration of HCG rises rapidly during early pregnancy to a level of 5,000 to 200,000 mIU/mL at 10-12  weeks, followed by a slow decline to levels of 1,000 to 50,000 mIU/mL during the 3rd trimester.    The use of this assay to monitor or diagnose patients with cancer or any other condiion unrelated to pregnancy has not been cleared or approved by the FDA or the manufacturer of the assay.   LIPASE - Normal    Lipase 26     BASIC METABOLIC PANEL    Sodium 139      Potassium 4.0      Chloride 105      CO2 (Bicarbonate) 23      Anion Gap 11      BUN 16      Creatinine 0.95      eGFRcr 79      Glucose 94      Fasting? Unknown      Calcium 9.4     CBC W/DIFF    Narrative:     The following orders were created for panel order CBC and differential.  Procedure                               Abnormality         Status                     ---------                               -----------         ------                     CBC w/ Differential[[id number]]          Abnormal            Final result                 Please view results for these tests on the individual orders.       ED Course & MDM   ED Course as of 05/16/22 0738   Sat May 16, 2022   0526 Basic metabolic panel   1610 RBC Morphology(!)   0527 CBC and differential(!)   0527 RBC Morphology(!)   0527 Hepatic function panel(!)   0527 Urinalysis reflex to culture(!)   0527 hCG, Quantitative         Diagnoses as of 05/16/22 0738    Generalized abdominal pain   Nausea and vomiting, unspecified vomiting type       Medical Decision Making  Patient presents with abdominal pain.  The patient has had ongoing abdominal pain and vomiting for a few months and evaluated by gastroenterology.  Patient is due to have an ultrasound in a few days to evaluate for possible biliary colic.  Lab work was obtained that did not demonstrate any leukocytosis, elevation in lipase, elevation in LFTs.  Abdomen notable for only mild tenderness without any peritoneal signs, negative Murphy sign, no tenderness to McBurney's point.  Doubt acute surgical process such as cholecystitis or acute appendicitis.  Given the patient's ongoing vomiting, the patient will be prescribed ondansetron for symptomatic relief.  She will follow-up with her gastroenterologist.    Generalized abdominal pain: acute illness or injury  Nausea and vomiting, unspecified vomiting type: acute illness or injury  Amount and/or Complexity of Data Reviewed  External Data Reviewed: notes.     Details: Multiple gastroenterology visits  noting work-up for possible ulcerative colitis versus Crohn's.  Patient had a colonoscopy in 2022  Labs: ordered. Decision-making details documented in ED Course.      Risk  Prescription drug management.                Vicente Masson, MD  05/16/22 [phone]

## 2022-05-16 NOTE — Unmapped (Signed)
Patient: Tiffany Leach   MRN: [id number]   Date of service: 05/16/2022   PCP: Charna Archer, MD     EMERGENCY DEPARTMENT ENCOUNTER    CHIEF COMPLAINT       Chief Complaint   Patient presents with   . Abdominal Pain       HPI     Tiffany Leach is a 41 y.o. female with a PMH of   Past Medical History:   Diagnosis Date   . Graves disease    . Irritable bowel syndrome     who presents to the ED for evaluation abdominal pain.  Patient reports she has had persistent vomiting for a few months.  She has a GI doctor who is continuously working up for colitis versus Crohn's disease.  She states tonight around 8 or 9 PM she developed severe upper abdominal pain.  She reports 1 episode of diarrhea.  No medications for the pain prior to arrival.  She denies fever or chills.        REVIEW OF SYSTEMS       Constitutional: Negative for fevers/chills, body aches, poor po intake and fatigue.  Eyes: Negative for blurry vision or visual disturbances.  ENT: Negative for sinus congestion or sore throat.  Cardiovascular: Negative for chest pain or palpitations.  Respiratory: Negative for cough or SOB.   Gastrointestinal: Abdominal pain, nausea, vomiting, diarrhea GU: Negative for urinary symptoms, hematuria, dysuria.   Skin: Negative for pallor or rash.   Neuro: Negative for altered mental status, headache, dizziness, lightheadedness, numbness or weakness.   Psych: Negative for ETOH or drug dependence.         PAST MEDICAL HISTORY         Past Medical History:   Diagnosis Date   . Graves disease    . Irritable bowel syndrome        CURRENT MEDICATIONS       Previous Medications    ALBUTEROL 90 MCG/ACTUATION INHALER    albuterol sulfate HFA 90 mcg/actuation aerosol inhaler   INHALE 2 PUFFS EVERY 4 TO 6 HOURS AS NEEDED *RINSE MOUTHPIECE AT LEAST ONCE PER WEEK*    BUTALBITAL-ACETAMINOP-CAF-COD (FIORICET W/CODEINE) 50-325-40-30 MG CAPSULE    50 mg if needed each day.    METHIMAZOLE (TAPAZOLE) 10 MG TABLET    Take 20 mg by mouth in the morning.     METHIMAZOLE (TAPAZOLE) 5 MG TABLET    Take 5 mg by mouth once daily.    MULTIVIT WITH MIN-FOLIC ACID (DAILY GUMMIES) 540 MCG TABLET,CHEWABLE    Chew 200 mcg in the morning.    ONDANSETRON (ZOFRAN) 4 MG TABLET    Take 8 mg by mouth if needed in the morning and at bedtime.    PROPYLTHIOURACIL (PTU) 50 MG TABLET    Take 25 mg by mouth twice daily.         FAMILY HISTORY       No family history on file.    SURGICAL HISTORY         Past Surgical History:   Procedure Laterality Date   . MOUTH SURGERY         ALLERGIES         Allergies   Allergen Reactions   . Imitrex [Sumatriptan]    . Amoxicillin Rash       SOCIAL HISTORY        Social History     Socioeconomic History   . Marital status: Single     Spouse  name: Not on file   . Number of children: Not on file   . Years of education: Not on file   . Highest education level: Not on file   Occupational History   . Not on file   Tobacco Use   . Smoking status: Former     Types: Cigarettes   . Smokeless tobacco: Never   Vaping Use   . Vaping Use: Never used   Substance and Sexual Activity   . Alcohol use: Not Currently   . Drug use: Yes     Types: Marijuana   . Sexual activity: Defer   Other Topics Concern   . Not on file   Social History Narrative    Feels safe at  home     Social Determinants of Health     Financial Resource Strain: Not on file   Food Insecurity: Not on file   Transportation Needs: Not on file   Physical Activity: Not on file   Stress: Not on file   Social Connections: Not on file   Intimate Partner Violence: Not on file   Housing Stability: Not on file       PHYSICAL EXAM        VITAL SIGNS:    ED Triage Vitals [05/16/22 0343]   Temp Pulse Resp BP   36.4 C (97.6 F) 107 18 129/85      SpO2 Temp Source Heart Rate Source Patient Position   97 % Oral -- --      BP Location FiO2 (%)     -- --          General: The patient appears awake, alert, non-toxic, and in no acute distress.  Head: Normocephalic, atraumatic.   Eyes: PERRL.  ENT: Moist mucus  membranes.  Neck: Supple, trachea midline.  Respiratory: CTAB, non-labored breathing, symmetrical chest expansion, no respiratory distress.  CVS: Regular rate and rhythm, no peripheral edema.   GI: Soft, mild tenderness in all 4 quadrants. Normoactive bowel sounds in all quadrants. No distention, masses or guarding.   Neuro: GCS15, AOx4, cranial nerves II-XII grossly intact, mentation is appropriate for age, moving all 4 extremities, steady gait.  Psych: Behavior is cooperative and pleasant. Affect is calm.  Skin: Warm, dry and intact. Normal capillary refill.       RESULTS        LABS  Labs Reviewed   URINALYSIS REFLEX TO CULTURE - Abnormal       Result Value    Color, Ur Yellow      Clarity, Ur Clear      Specific Gravity, Ur 1.026      pH, Ur 5.5      Protein, Ur Negative      Glucose, Ur Negative      Ketones, Ur Negative      Blood, Ur Trace (*)     Bilirubin, Ur Negative      Urobilinogen, Ur 0.2      Nitrite, Ur Negative      Leukocyte Esterase, Ur Negative      WBC, Ur 3      RBC, Ur 4      Squamous Epithelial Cells, Ur Negative      Bacteria, Ur Negative      Hyaline Casts, Ur 0.0     HEPATIC FUNCTION PANEL - Abnormal    Albumin 4.3      Bilirubin, total <0.2 (*)     Bilirubin, direct <0.2  Alkaline phosphatase 81      AST 22      ALT 15      Protein, total 7.4     CBC WITH DIFFERENTIAL - Abnormal    WBC 12.5 (*)     RBC 5.26 (*)     Hemoglobin 14.3      Hematocrit 43.5      MCV 82.7      MCH 27.2      MCHC 32.9      RDW-CV 13.1      RDW-SD 39.4      Platelets 346      MPV 9.4      Neutrophil % 47.7      Lymphocyte % 43.1      Monocytes % 6.6      Eosinophils % 1.9      Basophils % 0.5      Immature Granulocytes % 0.2      NRBC % 0.0      Neutrophils Absolute 5.95      Lymphocytes Absolute 5.37 (*)     Monocytes Absolute 0.82 (*)     Eosinophils Absolute 0.24      Basophils Absolute 0.06      Immature Granulocytes Absolute 0.02      NRBC Absolute 0.00     RBC MORPHOLOGY - Abnormal    RBC Morphology See  Indices (*)     Schistocytes 1+ (*)     Vacuolated Neutrophils Present (*)    HCG, QUANTITATIVE, PREGNANCY - Normal    hCG, serum, quantitative 1      Narrative:     Expected Results:  Adult Female: 0-1 mIU/mL  Non-pregnant Female: 0-4 mIU/mL    If pregnancy is expected, suggest repeat in 24-72 hours.    The concentration of HCG rises rapidly during early pregnancy to a level of 5,000 to 200,000 mIU/mL at 10-12 weeks, followed by a slow decline to levels of 1,000 to 50,000 mIU/mL during the 3rd trimester.    The use of this assay to monitor or diagnose patients with cancer or any other condiion unrelated to pregnancy has not been cleared or approved by the FDA or the manufacturer of the assay.   LIPASE - Normal    Lipase 26     BASIC METABOLIC PANEL    Sodium 139      Potassium 4.0      Chloride 105      CO2 (Bicarbonate) 23      Anion Gap 11      BUN 16      Creatinine 0.95      eGFRcr 79      Glucose 94      Fasting? Unknown      Calcium 9.4     CBC W/DIFF    Narrative:     The following orders were created for panel order CBC and differential.  Procedure                               Abnormality         Status                     ---------                               -----------         ------  CBC w/ Differential[[id number]]          Abnormal            Final result                 Please view results for these tests on the individual orders.       RADIOLOGY  No orders to display            ED COURSE & MEDICAL DECISION MAKING        Pertinent Labs & Imaging studies reviewed. (See chart for details)    Diagnoses as of 05/16/22 0535   Generalized abdominal pain   Nausea and vomiting, unspecified vomiting type       MDM  41 year old female presents for evaluation abdominal pain nausea vomiting.  Patient was afebrile she was mildly hypotensive.  Which improved with IV fluids.  She was given Zofran for nausea.  Her labs are within normal limits.  Patient ultrasound scheduled with her primary care  provider on Tuesday.  She has a GI doctor that she follows with currently.  I instructed the patient she needs close follow-up.  He was given return precautions and discharged    CLINICAL IMPRESSION  1. Generalized abdominal pain    2. Nausea and vomiting, unspecified vomiting type         Final Disposition: Discharge _______________________________________________________________________________________________    Enid Skeens, PA      Enid Skeens, [place]  05/16/22 [phone]

## 2022-05-16 NOTE — Discharge Instructions (Addendum)
You were seen tonight in the emergency department.  Your labs were within normal limits.  You were given Zofran for nausea and IV fluids.  Please follow-up with your care providers and your outpatient ultrasound as scheduled.  Return to emergency department for any worsening symptoms or concerns

## 2022-05-16 NOTE — ED Triage Notes (Signed)
Pt reports upper abd pain started yesterday. Pt reports nausea and vomiting for weeks.

## 2022-05-16 NOTE — Other (Signed)
Patient Education   Table of Contents     Abdominal Pain, Adult     To view videos and all your education online visit,   [email or web address]   or scan this QR code with your smartphone.   Access to this content will expire in one year.                    Abdominal Pain, Adult     Many things can cause belly (abdominal) pain. Most times, belly pain is not dangerous. Many cases of belly pain can be watched and treated at home. Sometimes, though, belly pain is serious. Your doctor will try to find the cause of your belly pain.   Follow these instructions at home:      Medicines     Take over-the-counter and prescription medicines only as told by your doctor.    Do not  take medicines that help you poop (laxatives) unless told by your doctor.     General instructions     Watch your belly pain for any changes.     Drink enough fluid to keep your pee (urine) pale yellow.     Keep all follow-up visits as told by your doctor. This is important.       Contact a doctor if:       Your belly pain changes or gets worse.     You are not hungry, or you lose weight without trying.     You are having trouble pooping (constipated) or have watery poop (diarrhea) for more than 2?3 days.     You have pain when you pee or poop.     Your belly pain wakes you up at night.     Your pain gets worse with meals, after eating, or with certain foods.     You are vomiting and cannot keep anything down.     You have a fever.     You have blood in your pee.     Get help right away if:       Your pain does not go away as soon as your doctor says it should.     You cannot stop vomiting.     Your pain is only in areas of your belly, such as the right side or the left lower part of the belly.     You have bloody or black poop, or poop that looks like tar.     You have very bad pain, cramping, or bloating in your belly.    You have signs of not having enough fluid or water in your body (dehydration), such as:     Dark pee, very little pee,  or no pee.     Cracked lips.     Dry mouth.     Sunken eyes.     Sleepiness.     Weakness.     You have trouble breathing or chest pain.     Summary       Many cases of belly pain can be watched and treated at home.     Watch your belly pain for any changes.     Take over-the-counter and prescription medicines only as told by your doctor.     Contact a doctor if your belly pain changes or gets worse.     Get help right away if you have very bad pain, cramping, or bloating in your belly.     This information is not intended to  replace advice given to you by your health care provider. Make sure you discuss any questions you have with your health care provider.     Document Released: 06/05/2009Document Revised: 04/27/2020Document Reviewed: 03/20/2019     Elsevier Patient Education ? [employer].

## 2022-05-18 ENCOUNTER — Inpatient Hospital Stay: Admit: 2022-05-18 | Payer: MEDICAID | Primary: Internal Medicine

## 2022-05-18 DIAGNOSIS — R112 Nausea with vomiting, unspecified: Secondary | ICD-10-CM

## 2022-05-19 LAB — BMP (EXT)
Anion Gap (EXT): 13 mmol/L (ref 3–17)
BUN (EXT): 11 mg/dL (ref 8–25)
CO2 (EXT): 23 mmol/L (ref 23–32)
CalciumCalcium (EXT): 9.5 mg/dL (ref 8.5–10.5)
Chloride (EXT): 104 mmol/L (ref 98–108)
Creatinine (EXT): 0.7 mg/dL (ref 0.60–1.50)
GFR Estimated (Calc) (EXT): 113 mL/min/{1.73_m2} (ref >59)
Glucose (EXT): 139 mg/dL — ABNORMAL HIGH (ref 70–110)
Potassium (EXT): 3.4 mmol/L (ref 3.4–5.0)
Sodium (EXT): 140 mmol/L (ref 135–145)

## 2022-05-28 LAB — BMP (EXT)
Anion Gap (EXT): 12 mmol/L (ref 3–17)
BUN (EXT): 19 mg/dL (ref 8–25)
CO2 (EXT): 24 mmol/L (ref 23–32)
CalciumCalcium (EXT): 9.3 mg/dL (ref 8.5–10.5)
Chloride (EXT): 101 mmol/L (ref 98–108)
Creatinine (EXT): 0.71 mg/dL (ref 0.60–1.50)
GFR Estimated (Calc) (EXT): 112 mL/min/{1.73_m2} (ref >59)
Glucose (EXT): 107 mg/dL (ref 70–110)
Potassium (EXT): 3.9 mmol/L (ref 3.4–5.0)
Sodium (EXT): 137 mmol/L (ref 135–145)

## 2022-05-29 ENCOUNTER — Encounter: Payer: MEDICAID | Primary: Internal Medicine

## 2022-05-29 ENCOUNTER — Encounter

## 2022-06-04 LAB — BMP (EXT)
Anion Gap (EXT): 10 mmol/L (ref 3–17)
BUN (EXT): 19 mg/dL (ref 8–25)
CO2 (EXT): 25 mmol/L (ref 23–32)
CalciumCalcium (EXT): 9.2 mg/dL (ref 8.5–10.5)
Chloride (EXT): 105 mmol/L (ref 98–108)
Creatinine (EXT): 0.93 mg/dL (ref 0.60–1.50)
GFR Estimated (Calc) (EXT): 80 mL/min/{1.73_m2} (ref >59)
Glucose (EXT): 94 mg/dL (ref 70–110)
Potassium (EXT): 4.1 mmol/L (ref 3.4–5.0)
Sodium (EXT): 140 mmol/L (ref 135–145)

## 2022-06-05 ENCOUNTER — Inpatient Hospital Stay: Admit: 2022-06-05 | Discharge: 2022-06-05 | Discharge status: Against Medical Advice | Payer: MEDICAID

## 2022-06-05 ENCOUNTER — Emergency Department: Payer: MEDICAID | Primary: Internal Medicine

## 2022-06-05 DIAGNOSIS — R079 Chest pain, unspecified: Secondary | ICD-10-CM

## 2022-06-05 LAB — CBC WITH DIFFERENTIAL
Basophils %: 0.7 %
Basophils Absolute: 0.08 10*3/uL (ref 0.00–0.22)
Eosinophils %: 0.9 %
Eosinophils Absolute: 0.11 10*3/uL (ref 0.00–0.50)
Hematocrit: 43 % (ref 32.0–47.0)
Hemoglobin: 14 g/dL (ref 11.0–16.0)
Immature Granulocytes %: 0.2 %
Immature Granulocytes Absolute: 0.03 10*3/uL (ref 0.00–0.10)
Lymphocyte %: 31.5 %
Lymphocytes Absolute: 3.79 10*3/uL (ref 0.70–4.00)
MCH: 27.3 pg (ref 26.0–34.0)
MCHC: 32.6 g/dL (ref 31.0–37.0)
MCV: 83.8 fL (ref 80.0–100.0)
MPV: 9 fL — ABNORMAL LOW (ref 9.1–12.4)
Monocytes %: 5 %
Monocytes Absolute: 0.6 10*3/uL (ref 0.36–0.77)
NRBC %: 0 % (ref 0.0–0.0)
NRBC Absolute: 0 10*3/uL (ref 0.00–2.00)
Neutrophil %: 61.7 %
Neutrophils Absolute: 7.41 10*3/uL (ref 1.50–7.95)
Platelets: 319 10*3/uL (ref 150–400)
RBC: 5.13 M/uL (ref 3.70–5.20)
RDW-CV: 13.2 % (ref 11.5–14.5)
RDW-SD: 40.2 fL (ref 35.0–51.0)
WBC: 12 10*3/uL — ABNORMAL HIGH (ref 4.0–11.0)

## 2022-06-05 LAB — COMPREHENSIVE METABOLIC PANEL
ALT: 22 U/L (ref 0–55)
AST: 10 U/L (ref 6–42)
Albumin: 3.9 g/dL (ref 3.2–5.0)
Alkaline phosphatase: 87 U/L (ref 30–130)
Anion Gap: 4 mmol/L (ref 3–14)
BUN: 12 mg/dL (ref 6–24)
Bilirubin, total: 0.2 mg/dL (ref 0.2–1.2)
CO2 (Bicarbonate): 26 mmol/L (ref 20–32)
Calcium: 8.7 mg/dL (ref 8.5–10.5)
Chloride: 107 mmol/L (ref 98–110)
Creatinine: 0.83 mg/dL (ref 0.55–1.30)
Glucose: 103 mg/dL (ref 70–110)
Potassium: 4.1 mmol/L (ref 3.6–5.2)
Protein, total: 7.5 g/dL (ref 6.0–8.4)
Sodium: 137 mmol/L (ref 135–146)
eGFRcr: 92 mL/min/{1.73_m2} (ref >=60)

## 2022-06-05 LAB — URINALYSIS REFLEX TO CULTURE
Bilirubin, Ur: NEGATIVE
Blood, Ur: NEGATIVE
Glucose,Ur: NEGATIVE mg/dL
Ketones, Ur: NEGATIVE mg/dL
Leukocyte Esterase, Ur: NEGATIVE WBC/uL
Nitrite, Ur: NEGATIVE
Protein,Ur: NEGATIVE mg/dL
Specific Gravity, Ur: 1.02 (ref 1.005–1.030)
Urobilinogen, Ur: 0.2 U/dL (ref 0.2–1.0)
pH, Ur: 8.5 — ABNORMAL HIGH (ref 5.0–8.0)

## 2022-06-05 LAB — HCG, URINE, QUALITATIVE: HCG Qualitative, Urine: NEGATIVE

## 2022-06-05 LAB — TROPONIN I, HIGH SENSITIVITY: Troponin I, High Sensitivity: <6 ng/L

## 2022-06-05 LAB — LIPASE: Lipase: 27 U/L (ref 13–75)

## 2022-06-05 NOTE — ED Triage Notes (Signed)
Chest pain x a couple of weeks, heart palpitations, weakness. Also has lower R sided abd pain/fractured ribs x1 month ago. Multiple complaints. +nausea/vomiting/diarrhea. Denies urinary symptoms.

## 2022-06-10 ENCOUNTER — Inpatient Hospital Stay: Admit: 2022-06-10 | Payer: MEDICAID | Primary: Internal Medicine

## 2022-06-10 LAB — BMP (EXT)
Anion Gap (EXT): 12 mmol/L (ref 3–17)
BUN (EXT): 13 mg/dL (ref 8–25)
CO2 (EXT): 22 mmol/L — ABNORMAL LOW (ref 23–32)
CalciumCalcium (EXT): 9.2 mg/dL (ref 8.5–10.5)
Chloride (EXT): 105 mmol/L (ref 98–108)
Creatinine (EXT): 0.8 mg/dL (ref 0.60–1.50)
GFR Estimated (Calc) (EXT): 96 mL/min/{1.73_m2} (ref >59)
Glucose (EXT): 79 mg/dL (ref 70–110)
Potassium (EXT): 4.5 mmol/L (ref 3.4–5.0)
Sodium (EXT): 139 mmol/L (ref 135–145)

## 2022-06-12 ENCOUNTER — Ambulatory Visit: Admit: 2022-06-12 | Discharge: 2022-06-12 | Payer: MEDICAID | Primary: Internal Medicine

## 2022-06-12 DIAGNOSIS — K5000 Crohn's disease of small intestine without complications: Secondary | ICD-10-CM

## 2022-06-12 DIAGNOSIS — K51919 Ulcerative colitis, unspecified with unspecified complications: Secondary | ICD-10-CM

## 2022-06-12 MED ORDER — sodium chloride 0.9 % flush 10 mL
Freq: Once | INTRAMUSCULAR | Status: DC
Start: 2022-06-12 — End: 2022-06-12

## 2022-06-12 MED ORDER — sodium chloride 0.9 % flush 10 mL
Freq: Once | INTRAMUSCULAR | Status: DC | PRN
Start: 2022-06-12 — End: 2022-06-12

## 2022-06-12 MED ORDER — inFLIXimab (Remicade) 400 mg in sodium chloride 0.9 % 250 mL IVPB
0.9 | Freq: Once | INTRAVENOUS | Status: AC
Start: 2022-06-12 — End: 2022-06-12
  Administered 2022-06-12: 14:00:00 via INTRAVENOUS

## 2022-06-12 MED FILL — IVPB BUILDER: 100 mg | INTRAVENOUS | Qty: 40

## 2022-06-12 NOTE — Progress Notes (Signed)
Pt here at [facility] for her Remicade infusion. Pt c/o abdominal pain, weight loss, poor appetite. Pt states she has been in and out of both [facility] and [facility] recently. Pt has f/u with GI soon to discuss plan of care. Pt tolerated infusion over 2hrs, no adverse reactions noted. Pt aware of next appointment.

## 2022-06-15 ENCOUNTER — Inpatient Hospital Stay
Admission: EM | Admit: 2022-06-16 | Discharge: 2022-06-18 | Disposition: A | Discharge status: Home / Self Care | Payer: MEDICAID | Admitting: Emergency Medicine

## 2022-06-15 DIAGNOSIS — R109 Unspecified abdominal pain: Secondary | ICD-10-CM

## 2022-06-15 DIAGNOSIS — R1115 Cyclical vomiting syndrome unrelated to migraine: Secondary | ICD-10-CM

## 2022-06-15 MED ORDER — ondansetron ODT (Zofran-ODT) disintegrating tablet 4 mg
4 | Freq: Once | ORAL | Status: AC
Start: 2022-06-15 — End: 2022-06-15
  Administered 2022-06-16: 03:00:00 4 mg via ORAL

## 2022-06-15 NOTE — ED Provider Notes (Signed)
HPI   Chief Complaint   Patient presents with   . Abdominal Pain       HPI     This is a 41 year old female, history of Graves' disease, irritable bowel syndrome on remicade (still being worked up for [employer] or ulcerative colitis), h/o Bell's palsy, shingles, presenting to the emergency department with abdominal pain, rectal pain and blood in the stool.  Epigastric region, radiating to the right lower quadrant, with nausea vomiting.  In addition she has had significant rectal pressure, and reports that today she tried to move of bowel but she was unable to, then last night noted blood in the toilet for which she took a picture on her cell phone.  In addition she reports ongoing headache, confusion, fatigue, weight loss of about 10 pounds in 3 weeks, night sweats, no fevers.  She reports also bruising, no rashes, no tick bites that she is aware of.  She is also been having tingling sensation.  she reports she is being followed up by gastroenterology, she had a colonoscopy, it was on conclusive so far dx IBD (Crohn's versus colitis diagnosed in question based on biopsy) saw GI june had [place] abd which was nl, , she is due for repeat colonoscopy in September, she was recently seen at [facility] in the emergency department for the same symptoms.(June 10 2022) and dx home. Pt states she is very frustrated as nobody seems to be able to help her sxs she is currently on omeprazole, Carafate.              Glasgow Coma Scale Score: 15                                  Patient History   Past Medical History:   Diagnosis Date   . Graves disease    . Irritable bowel syndrome      Past Surgical History:   Procedure Laterality Date   . MOUTH SURGERY       No family history on file.  Social History     Tobacco Use   . Smoking status: Former     Types: Cigarettes   . Smokeless tobacco: Never   Vaping Use   . Vaping Use: Never used   Substance Use Topics   . Alcohol use: Not Currently   . Drug use: Yes     Types: Marijuana        Review of Systems   Review of Systems    Physical Exam   ED Triage Vitals [06/15/22 2010]   Temp Pulse Resp BP   37.2 C (98.9 F) 95 16 130/88      SpO2 Temp Source Heart Rate Source Patient Position   98 % Oral Monitor Sitting      BP Location FiO2 (%)     Left arm --       Physical Exam  Vitals and nursing note reviewed.   Constitutional:       General: She is not in acute distress.     Appearance: She is well-developed.   HENT:      Head: Normocephalic and atraumatic.   Eyes:      Conjunctiva/sclera: Conjunctivae normal.   Cardiovascular:      Rate and Rhythm: Normal rate and regular rhythm.      Heart sounds: No murmur heard.  Pulmonary:      Effort: Pulmonary effort is  normal. No respiratory distress.      Breath sounds: Normal breath sounds.   Abdominal:      Palpations: Abdomen is soft.      Tenderness: There is abdominal tenderness in the right lower quadrant and epigastric area.      Comments: DRE : no stool, yellow mucus   Musculoskeletal:         General: No swelling.      Cervical back: Neck supple.   Skin:     General: Skin is warm and dry.      Capillary Refill: Capillary refill takes less than 2 seconds.      Comments: A few small ecchymoses noted over the lower extremities. No petechiae, no rashes noted   Neurological:      Mental Status: She is alert.   Psychiatric:         Mood and Affect: Mood normal.       CT ABDOMEN PELVIS W CONTRAST   Final Result   No acute intra-abdominal nor pelvic abnormality. A normal appendix is identified. There is increased stool retention within the colon consistent with constipation.      Tollie Eth 06/16/2022 6:13 AM          Labs Reviewed   COMPREHENSIVE METABOLIC PANEL - Abnormal       Result Value    Sodium 138      Potassium 4.0      Chloride 108      CO2 (Bicarbonate) 23      Anion Gap 7      BUN 17      Creatinine 0.96      eGFRcr 77      Glucose 147 (*)     Fasting? Unknown      Calcium 8.7      AST 23      ALT 22      Alkaline phosphatase 80       Protein, total 7.3      Albumin 3.6      Bilirubin, total 0.2     CBC WITH DIFFERENTIAL - Abnormal    WBC 14.9 (*)     RBC 5.05      Hemoglobin 13.7      Hematocrit 42.5      MCV 84.2      MCH 27.1      MCHC 32.2      RDW-CV 13.2      RDW-SD 40.5      Platelets 305      MPV 9.1      NRBC % 0.0      NRBC Absolute 0.00     URINALYSIS REFLEX TO CULTURE - Abnormal    Color, Ur Yellow      Clarity, Ur Clear      Specific Gravity, Ur 1.015      pH, Ur 6.5      Protein,Ur Negative      Glucose,Ur Negative      Ketones, Ur Negative      Bilirubin, Ur Negative      Blood, Ur Negative      Urobilinogen, Ur 0.2      Nitrite, Ur Negative      Leukocyte Esterase, Ur Trace (*)     RBC, Ur 1      WBC, Ur 1      Epithelial Cells, UR 1      Bacteria, Ur None Seen      Casts, Ur 0  MANUAL DIFFERENTIAL - Abnormal    Neutrophils % 59      Lymphocytes % 36      Monocytes % 5      Neutrophils Absolute 8.79 (*)     Lymphocytes Absolute 5.36 (*)     Monocytes Absolute 0.75      RBC Morphology Normal      PLT Morphology Normal      Total WBC Counted 100     LIPASE - Normal    Lipase 35      Narrative:     Reference Range updated as 11/11/2021   C-REACTIVE PROTEIN - Normal    CRP <0.29     SEDIMENTATION RATE, AUTOMATED - Normal    Sed Rate 20     PROTIME-INR - Normal    Protime 10.9      INR 1.00     PTT - Normal    aPTT 30     CBC W/DIFF    Narrative:     The following orders were created for panel order CBC and differential.  Procedure                               Abnormality         Status                     ---------                               -----------         ------                     CBC w/ Differential[[id number]]          Abnormal            Final result               Manual Differential (LAB.Marland KitchenMarland Kitchen[[id number]]  Abnormal            Final result                 Please view results for these tests on the individual orders.   HCG, SERUM, QUALITATIVE    hCG Qualitative Serum Negative     LYME ANTIBODIES W/ RFLX IB       EKG sinus  rhythm, heart rate 71, normal axis, normal intervals.  No acute ischemic changes.  No significant changes compared to prior EKG June 05, 2022.    This is a 46 F here due to abd pain, rectal bleeding and constellation sxs as per HPI.  Patient overall nontoxic-appearing, vital stable. ddx includes exacerbation of chronic colitis, versus acute appendicitis, diverticulitis, rectal abscess, fissure.  In addition she does presented with a constellation of symptoms including weight loss, night sweats, fatigue, bruising.  Labs are notable for leukocytosis 14.9.  Electrolytes within normal limit.  I will add a CT abdomen pelvis to rule out acute appendicitis or other pathology.  Analgesia, given uncontrolled pain, rectal bleeding the plan for admission    Ed course   Ct a/p w/o acute findings.  Noted constipations.  Case discussed with hospitalist Dr Teena Dunk who accepted patient for admission    ED Course & MDM   Diagnoses as of 06/16/22 0632   Abdominal pain, unspecified abdominal location   Rectal bleeding  MDM          Kenn File, MD  06/16/22 [phone]

## 2022-06-15 NOTE — ED Triage Notes (Signed)
Pt reports that she has been having abd pain for the past few months and has been in and out of the hospital for abd pain multiple times. Pt reports that she has been having internal rectal pressure today and severe pain. Pt reports that she has a hx of shingles, bells palsy, and a fib. States she has a colonoscopy scheduled for September. Pt reports dark black stool today.

## 2022-06-16 ENCOUNTER — Emergency Department: Admit: 2022-06-16 | Payer: MEDICAID | Primary: Internal Medicine

## 2022-06-16 LAB — COMPREHENSIVE METABOLIC PANEL
ALT: 22 U/L (ref 0–55)
AST: 23 U/L (ref 6–42)
Albumin: 3.6 g/dL (ref 3.2–5.0)
Alkaline phosphatase: 80 U/L (ref 30–130)
Anion Gap: 7 mmol/L (ref 3–14)
BUN: 17 mg/dL (ref 6–24)
Bilirubin, total: 0.2 mg/dL (ref 0.2–1.2)
CO2 (Bicarbonate): 23 mmol/L (ref 20–32)
Calcium: 8.7 mg/dL (ref 8.5–10.5)
Chloride: 108 mmol/L (ref 98–110)
Creatinine: 0.96 mg/dL (ref 0.55–1.30)
Glucose: 147 mg/dL — ABNORMAL HIGH (ref 70–110)
Potassium: 4 mmol/L (ref 3.6–5.2)
Protein, total: 7.3 g/dL (ref 6.0–8.4)
Sodium: 138 mmol/L (ref 135–146)
eGFRcr: 77 mL/min/{1.73_m2} (ref >=60)

## 2022-06-16 LAB — MANUAL DIFFERENTIAL
Lymphocytes %: 36 %
Lymphocytes Absolute: 5.36 10*3/uL — ABNORMAL HIGH (ref 0.70–4.00)
Monocytes %: 5 %
Monocytes Absolute: 0.75 10*3/uL (ref 0.36–0.77)
Neutrophils %: 59 %
Neutrophils Absolute: 8.79 10*3/uL — ABNORMAL HIGH (ref 1.50–7.95)
PLT Morphology: NORMAL
RBC Morphology: NORMAL
Total WBC Counted: 100

## 2022-06-16 LAB — CBC WITH DIFFERENTIAL
Hematocrit: 42.5 % (ref 32.0–47.0)
Hemoglobin: 13.7 g/dL (ref 11.0–16.0)
MCH: 27.1 pg (ref 26.0–34.0)
MCHC: 32.2 g/dL (ref 31.0–37.0)
MCV: 84.2 fL (ref 80.0–100.0)
MPV: 9.1 fL (ref 9.1–12.4)
NRBC %: 0 % (ref 0.0–0.0)
NRBC Absolute: 0 10*3/uL (ref 0.00–2.00)
Platelets: 305 10*3/uL (ref 150–400)
RBC: 5.05 M/uL (ref 3.70–5.20)
RDW-CV: 13.2 % (ref 11.5–14.5)
RDW-SD: 40.5 fL (ref 35.0–51.0)
WBC: 14.9 10*3/uL — ABNORMAL HIGH (ref 4.0–11.0)

## 2022-06-16 LAB — URINALYSIS REFLEX TO CULTURE
Bacteria, Ur: NONE SEEN
Bilirubin, Ur: NEGATIVE
Blood, Ur: NEGATIVE
Casts, Ur: 0 /LPF (ref 0–4)
Epithelial Cells, UR: 1 {cells}/[HPF] (ref 0–5)
Glucose,Ur: NEGATIVE mg/dL
Ketones, Ur: NEGATIVE mg/dL
Nitrite, Ur: NEGATIVE
Protein,Ur: NEGATIVE mg/dL
RBC, Ur: 1 {cells}/[HPF] (ref 0–4)
Specific Gravity, Ur: 1.015 (ref 1.005–1.030)
Urobilinogen, Ur: 0.2 U/dL (ref 0.2–1.0)
WBC, Ur: 1 {cells}/[HPF] (ref 0–5)
pH, Ur: 6.5 (ref 5.0–8.0)

## 2022-06-16 LAB — PTT: aPTT: 30 s (ref 23–32)

## 2022-06-16 LAB — LAVENDER TOP

## 2022-06-16 LAB — PROTIME-INR
INR: 1
Protime: 10.9 s (ref 9.3–11.6)

## 2022-06-16 LAB — LIGHT BLUE TOP

## 2022-06-16 LAB — C-REACTIVE PROTEIN: CRP: <0.29 mg/dL (ref 0.29–0.80)

## 2022-06-16 LAB — HCG, SERUM, QUALITATIVE: hCG Qualitative Serum: NEGATIVE

## 2022-06-16 LAB — LIPASE: Lipase: 35 U/L (ref 13–75)

## 2022-06-16 LAB — RAINBOW DRAW LT. GREEN PST TOP

## 2022-06-16 LAB — SEDIMENTATION RATE, AUTOMATED: Sed Rate: 20 mm/h (ref 0–20)

## 2022-06-16 LAB — RAINBOW DRAW SST GOLD TOP

## 2022-06-16 MED ORDER — traMADol (Ultram) tablet 50 mg
50 | Freq: Four times a day (QID) | ORAL | Status: DC | PRN
Start: 2022-06-16 — End: 2022-06-18
  Administered 2022-06-17: 04:00:00 50 mg via ORAL

## 2022-06-16 MED ORDER — ondansetron ODT (Zofran-ODT) disintegrating tablet 4 mg
4 | Freq: Three times a day (TID) | ORAL | Status: DC | PRN
Start: 2022-06-16 — End: 2022-06-18

## 2022-06-16 MED ORDER — multivitamin-minerals (Theragran-M) tablet 1 tablet
9 | Freq: Every day | ORAL | Status: DC
Start: 2022-06-16 — End: 2022-06-18
  Administered 2022-06-17: 14:00:00 1 via ORAL

## 2022-06-16 MED ORDER — sodium chloride 0.9 % infusion
0.9 | INTRAVENOUS | Status: DC
Start: 2022-06-16 — End: 2022-06-18
  Administered 2022-06-16 – 2022-06-17 (×4): 125 mL/h via INTRAVENOUS

## 2022-06-16 MED ORDER — naloxone (Narcan) injection 0.1 mg
0.4 | INTRAMUSCULAR | Status: DC | PRN
Start: 2022-06-16 — End: 2022-06-18

## 2022-06-16 MED ORDER — sodium chloride 0.9 % bolus 1,000 mL
0.9 | Freq: Once | INTRAVENOUS | Status: AC
Start: 2022-06-16 — End: 2022-06-16
  Administered 2022-06-16: 09:00:00 1000 mL via INTRAVENOUS

## 2022-06-16 MED ORDER — acetaminophen (Tylenol) tablet 650 mg
325 | Freq: Four times a day (QID) | ORAL | Status: DC | PRN
Start: 2022-06-16 — End: 2022-06-18

## 2022-06-16 MED ORDER — iohexol (OMNIPaque) 350 mg iodine/mL solution 100 mL
350 | Freq: Once | INTRAVENOUS | Status: AC
Start: 2022-06-16 — End: 2022-06-16
  Administered 2022-06-16: 09:00:00 100 mL via INTRAVENOUS

## 2022-06-16 MED ORDER — acetaminophen (Tylenol) solution 650 mg
325 | Freq: Four times a day (QID) | ORAL | Status: DC | PRN
Start: 2022-06-16 — End: 2022-06-18

## 2022-06-16 MED ORDER — melatonin tablet 3 mg
3 | Freq: Every evening | ORAL | Status: DC | PRN
Start: 2022-06-16 — End: 2022-06-18
  Administered 2022-06-17: 01:00:00 3 mg via ORAL

## 2022-06-16 MED ORDER — morphine injection 4 mg
4 | Freq: Once | INTRAVENOUS | Status: AC
Start: 2022-06-16 — End: 2022-06-16
  Administered 2022-06-16: 09:00:00 4 mg via INTRAVENOUS

## 2022-06-16 MED ORDER — albuterol 2.5 mg /3 mL (0.083 %) nebulizer solution 2.5 mg
2.5 | RESPIRATORY_TRACT | Status: DC | PRN
Start: 2022-06-16 — End: 2022-06-18

## 2022-06-16 MED ORDER — pantoprazole (ProtoNix) injection 40 mg
40 | INTRAVENOUS | Status: DC
Start: 2022-06-16 — End: 2022-06-18
  Administered 2022-06-17: 14:00:00 40 mg via INTRAVENOUS

## 2022-06-16 MED ORDER — methIMAzole (Tapazole) tablet 5 mg
5 | Freq: Every day | ORAL | Status: DC
Start: 2022-06-16 — End: 2022-06-16

## 2022-06-16 MED ORDER — acetaminophen (Tylenol) suppository 650 mg
650 | Freq: Four times a day (QID) | RECTAL | Status: DC | PRN
Start: 2022-06-16 — End: 2022-06-18

## 2022-06-16 MED ORDER — morphine injection 4 mg
4 | INTRAVENOUS | Status: DC | PRN
Start: 2022-06-16 — End: 2022-06-18
  Administered 2022-06-16 – 2022-06-17 (×6): 4 mg via INTRAVENOUS

## 2022-06-16 MED ORDER — propylthiouracil (PTU) tablet 25 mg
50 | Freq: Two times a day (BID) | ORAL | Status: DC
Start: 2022-06-16 — End: 2022-06-18
  Administered 2022-06-16 – 2022-06-17 (×2): 25 mg via ORAL

## 2022-06-16 MED ORDER — polyethylene glycol (Glycolax) packet 17 g
17 | Freq: Two times a day (BID) | ORAL | Status: DC | PRN
Start: 2022-06-16 — End: 2022-06-18
  Administered 2022-06-16: 20:00:00 17 g via ORAL

## 2022-06-16 MED ORDER — ondansetron (Zofran) injection 4 mg
4 | Freq: Three times a day (TID) | INTRAMUSCULAR | Status: DC | PRN
Start: 2022-06-16 — End: 2022-06-18
  Administered 2022-06-16 – 2022-06-17 (×3): 4 mg via INTRAVENOUS

## 2022-06-16 MED FILL — ONDANSETRON HCL (PF) 4 MG/2 ML INJECTION SOLUTION: 4 4 mg/2 mL | INTRAMUSCULAR | Qty: 2

## 2022-06-16 MED FILL — MORPHINE 4 MG/ML INTRAVENOUS SOLUTION WRAPPER: 4 4 mg/mL | INTRAVENOUS | Qty: 1

## 2022-06-16 MED FILL — PROPYLTHIOURACIL 50 MG TABLET: 50 50 mg | ORAL | Qty: 10

## 2022-06-16 MED FILL — PROPYLTHIOURACIL 50 MG TABLET: 50 50 mg | ORAL | Qty: 1

## 2022-06-16 MED FILL — POLYETHYLENE GLYCOL 3350 17 GRAM ORAL POWDER PACKET: 17 17 gram | ORAL | Qty: 1

## 2022-06-16 NOTE — Progress Notes (Signed)
PHARMACY MEDICATION RECONCILIATION NOTE  Tiffany Leach is a 41 y.o. female who was admitted for Abdominal pain, unspecified abdominal location    Rectal bleeding .     Pharmacy completed a medication reconciliation, and updated the patient's admission medications. Home medications are as follows:    Medication Documentation Review Audit     Reviewed by Loraine Maple, PharmD (Pharmacist) on 06/16/22 at 1144    Medication Sig   albuterol 90 mcg/actuation inhaler albuterol sulfate HFA 90 mcg/actuation aerosol inhaler   INHALE 2 PUFFS EVERY 4 TO 6 HOURS AS NEEDED *RINSE MOUTHPIECE AT LEAST ONCE PER WEEK*   cholecalciferol, vitamin D3, 50 mcg (2,000 unit) capsule Take 1 capsule by mouth once daily.   famotidine (Pepcid) 20 mg tablet Take 20 mg by mouth twice daily.   fluticasone (Flonase) 50 mcg/actuation nasal spray Administer 1 spray into each nostril twice daily.   infliximab (REMICADE IV) Infuse 1 Dose into a venous catheter Once every 6 (six) weeks.   omeprazole (PriLOSEC) 20 mg DR capsule Take 20 mg by mouth once daily.   ondansetron (Zofran) 4 mg tablet Take 8 mg by mouth if needed in the morning and at bedtime.   orphenadrine (Norflex) 100 mg 12 hr tablet Take 100 mg by mouth if needed at bedtime.   propylthiouracil (PTU) 50 mg tablet Take 25 mg by mouth twice daily.               Pharmacy encounter information and recommendations    . Medication history was obtained via patient interview and external pharmacy history  . Patient appears to be compliant to home medications  . The following medications were updated in the patient's admission medication reconciliation   o Removed methimazole, fioricet   o Confirmed patient is now on PTU and not methimazole, all other medications reviewed against external RX, it seems patient has also recently updated her own list on [facility] app  . Recommendations for team   o Medications ordered appropriately at this time    Reviewed allergy profile:  Allergies   Allergen  Reactions   . Imitrex [Sumatriptan]    . Metoprolol Succinate      Other reaction(s): malaise, migraines   . Penicillins Hives   . Amoxicillin Rash     Preferred Pharmacy: [facility]    Please contact inpatient pharmacy with any questions.    Thank you,    Loraine Maple, PharmD   Clinical Staff Pharmacist  Available via [employer]  ASCOM [id number]

## 2022-06-16 NOTE — Nursing Note (Signed)
Pt calm and cooperative during shift.  HR remains in low 40's-high 30's.  Pt given Morphine x1 with good effect for 10/10 pain.  IVF running at 125/hr.  Cardiologist consult input and MD notified.  ECHO ordered per MD.  Neuros intact w/no CP or SOB reported.  Vomitted x1 in room bathroom.  Will continue to monitor

## 2022-06-16 NOTE — Progress Notes (Signed)
06/16/22 1059   Case Management Initial Assessment   Patient Discharged Before Able to Interview/Assess No   Source of Information Patient   Type of Residence Multilevel Home   Lives With Family Members   Discharge Services Anticipated at this Time No   Expected Discharge Needs None   Patient Information   Interpreter Needs None   Home Caregiver Self   Accompanied by None   Support Systems Children/Family   Need PCP on Discharge No   Current Patient Responsibilities Personal Care;Laundry;Driving;Meal Prep;Shopping;Housekeeping   PTA Functional Status   Is patient prescribed an anti-coagulant? No   Current Functional Status   Bathing Independent   Toileting Independent   Walking in Home Independent   Assistive Device Used None   Community Mobility Independent   IADL Assistance Needed No   Financial Information   Insurance Confirmed with Patient Yes   Financial Coordination Notified Not Needed   Legal Information   Does Patient Have HCP No   Discharge Planning   Patient/Family/Caregiver Discharge Preference Home   Anticipated Discharge Disposition Home   Next Actions   Assessment/High Risk Screening Complete   Case Management Progress Note: Patient presented to the ER with complaints of abdominal pain for the past few months she has been in and out of the hospital for same complaints.  Patient reports a history of shingles, Bell's palsy, and A-fib however she states she is not on any medication for her A-fib.  Also significant history of Graves' disease, IBS she is on Remicade.  Met with patient in the ER she had numerous complaints including stated weight loss of 15 pounds in 3 weeks.  She also stated that her Apple Watch was showing her heart rate in the low 40s.  Per MD note patient admits to using marijuana Gummies and vaping marijuana 4-5 times a day for the past year.  Anticipate home on discharge COC will follow.Bernette MayersDouglas Kateria Cutrona, RN  06/16/2022

## 2022-06-16 NOTE — ED Notes (Signed)
This RN spoke with Dr. Patsy LagerParihar regarding patient's heart rate dropping. MD aware. No new orders at this time.      Lolita CramKatherine Anushree Dorsi, RN  06/16/22 1100

## 2022-06-16 NOTE — H&P (Addendum)
If you are the patient referred to in this chart and reviewing the medical notes, please note that medical documentation is often written with abbreviations in medical terminology.  Physician documentation is typically written by physicians for other physicians to review in order to document what happened, tests that were ordered/interpreted and the diagnosis in the most efficient way possible.  These notes are made available for patients to review but are not specifically written with patient consumption in mind.     History of presenting illness:  41 year old female with past medical history of Graves' disease, questionable ulcerative colitis Crohn's disease on Remicade with last infusion last Friday, Bell's palsy, chronic marijuana use who presents with persistent nausea vomiting and abdominal pain.    Patient reports she follows with a gastroenterologist with [place] by the name of Dr. Jannet Askew who she last saw on 7/3.  Had EGD that showed duodenitis and was started on PPI.  No ulcers.  She reports her last colonoscopy was in August 2022 there the colonoscopy was equally vocal and she had some atypical cancerous cells and was asked to repeat a colonoscopy already scheduled for sleep September 2023 at [facility].  She was started on Remicade this January and gets infusion every 6 weeks with the last infusion last Friday.  She notes she has had persistent abdominal pain which she describes as mainly in the epigastric and also in the right lower quadrant with associated increase in stool frequency to 5-6 times compared to baseline 2 times a day in the past few weeks.  She also gets bloated with minimal food intake.  She is also had poor appetite along with persistent multiple episodes of vomiting a day.  She has lost 10 pounds in the past 3 weeks has nights with sweats and fatigue and she is sick of abdominal pain.  She was evaluated at [facility] emergency room on 06/10/22 and was discharged with some PPI Carafate and Pepcid and asked  for regular GI follow-up.  At [facility] she had requested for whole-body MRI and colonoscopy for expedited work-up which was declined.  Otherwise she denies being any history of street narcotic drug abuse.    She also notes that she has been smoking marijuana along with using Gummies and vaping marijuana to limit her on a consistent basis 4-5 times a day for the past year before that she used to do recreationally a few times a week.  She has not noted nausea gets better with hot showers as she has not tried it.    Currently in ER her lipase is normal LFTs are normal CBC is unremarkable.  His CT abdomen pelvis with contrast was completely unremarkable.  She continues to complain of abdominal pain requiring IV morphine.  GI has been consulted and and she has been kept NPO.  She is requesting expedited colonoscopy.  She does report compliance with Pepcid along with omeprazole and Carafate for the past 2 weeks.    Review of systems    All complete 14 review of systems negative except as mentioned above.    Past medical history  Past Medical History:   Diagnosis Date   . Graves disease    . Irritable bowel syndrome    Pulse [place]  Questionable with history of ulcerative cellulitis or Crohn's disease per patient is equal: Last colonoscopy on Remicade  Chronic marijuana use    Past surgical history  Past Surgical History:   Procedure Laterality Date   . MOUTH SURGERY  Social history  Social History     Socioeconomic History   . Marital status: Single     Spouse name: Not on file   . Number of children: Not on file   . Years of education: Not on file   . Highest education level: Not on file   Occupational History   . Not on file   Tobacco Use   . Smoking status: Former     Types: Cigarettes   . Smokeless tobacco: Never   Vaping Use   . Vaping Use: Never used   Substance and Sexual Activity   . Alcohol use: Not Currently   . Drug use: Yes     Types: Marijuana   . Sexual activity: Defer   Other Topics Concern   . Not on  file   Social History Narrative    Feels safe at  home     Social Determinants of Health     Financial Resource Strain: Not on file   Food Insecurity: Not on file   Transportation Needs: Not on file   Physical Activity: Not on file   Stress: Not on file   Social Connections: Not on file   Intimate Partner Violence: Not on file   Housing Stability: Not on file     Patient denies any smoking alcohol illicit drug use.  Denies any history of narcotic use.  Accompanied with her fianc Apolinar Junes was present bedside.    Family history:  Maternal uncle with colon cancer, another uncle with leukemia, grandfather with colon cancer in late 15s.  No family history of ulcerative colitis or Crohn's disease.      Physical examination:  Visit Vitals  BP 127/84   Pulse 50   Temp 37.2 C (98.9 F) (Oral)   Resp 14       General: Awake alert oriented x3, no apparent distress  HEENT: Normocephalic atraumatic, moist mucous membranes  Neck: Supple, no elevated JVD  Chest: Clear to auscultation bilaterally, no wheezes rales or rhonchi  CVS: S1-S2 heard no mild sinus rhythm, no murmur rubs  Abdomen: Soft , mildly distended, hypoactive bowel sounds, nontender  Skin: No rash or ecchymosis  Extremities:  no cyanosis, clubbing or edema  Psychiatric:  No overt depression, maintains eye contact, cheerful  Neuro: grossly non focal    Labs:  Lab Results   Component Value Date    WBC 14.9 (H) 06/15/2022    HGB 13.7 06/15/2022    HCT 42.5 06/15/2022    MCV 84.2 06/15/2022    PLT 305 06/15/2022     Lab Results   Component Value Date    GLUCOSE 147 (H) 06/15/2022    CALCIUM 8.7 06/15/2022    NA 138 06/15/2022    K 4.0 06/15/2022    CO2 23 06/15/2022    CL 108 06/15/2022    BUN 17 06/15/2022    CREATININE 0.96 06/15/2022         Imaging:  === 06/15/22 ===    CT ABDOMEN PELVIS W CONTRAST    - Impression -  No acute intra-abdominal nor pelvic abnormality. A normal appendix is identified. There is increased stool retention within the colon consistent with  constipation.    Tollie Eth 06/16/2022 6:13 AM    === 04/10/22 ===    XR CHEST 2 VIEWS    - Impression -  No acute pulmonary abnormality.    Alexi Otrakji 04/10/2022 3:47 PM    Last colonoscopy October 2022:  Impression:         -  Diverticulosis in the proximal ascending colon.         -  The examined portion of the ileum was normal.  Biopsied.         -  Four biopsies were obtained in the ascending colon.         -  Three random biopsies were obtained in the descending colon.         -  Four random biopsies were obtained in the transverse colon.         -  Four random biopsies were obtained in the sigmoid colon.         -  Three random biopsies were obtained in the rectum.         -  The examination was otherwise normal on direct and retroflexion views,            except for internal hemorrhoids  Recommendation:         -  Await pathology results.         -  Repeat colonoscopy in 3 years for surveillance.        Follow up in the office with me for continued monitoring of Crohn's    Assessment and plan  41 year old female with past medical history of Graves' disease, questionable ulcerative colitis Crohn's disease on Remicade with last infusion last Friday, Bell's palsy, chronic marijuana use who presents with persistent nausea vomiting and abdominal pain.    1.  Abdominal pain  Etiology unclear, LFTs lipase normal, CRP less than 0.29,ESR 20,  CT abdomen pelvis with contrast without acute abnormality.  She reports increase in stool frequency without blood along with some weight loss dyspepsia and persistent abdominal pain in the setting of daily cannabinoid use.  For now symptomatic control will consult GI for repeat colonoscopy.  She is on Remicade with last infusion last Friday.  We will check stool cultures along with ova parasite    2.  Persistent nausea vomiting   currently patient tells me she cannot keep anything  P.o.  We will continue with symptomatic management with Zofran and Reglan along with  diphenhydramine.  Continue with IV fluids.  She has been having multiple episodes of vomiting per her for the past few weeks, BUN on presentation 17 without AKI  Symptomatic management   Hyperemesis cannabis syndrome in the differential counseled patient to limit and if possible stop marijuana altogether and see if she improves,    3.  Graves' disease  We will continue with methimazole and PTU    4.  Sinus bradycardia  Heart rate goes to upper 40s low 50s asymptomatic patient blood pressure stable.  Per patient she has seen Dr. Freada Bergeron while in the past 482.  Atrial fibrillation currently normal sinus rhythm. C/W Telemetry monitoring    DVT Ppx-subcu heparin  Code status-full code    Disclaimer: This note was generated with Dragon Medical voice recognition program.  Please excuse any errors which may have been overlooked during review.  Sometimes, these errors may affect the content or meaning of a given sentence.     Brindy Higginbotham S. Brynne Doane MBBS, MBA  Hospitalist  [facility]

## 2022-06-16 NOTE — Consults (Signed)
[facility]  [phone]      Chief Complaint: abd pain, n/v     History of Present Illness:  41 yo female with history of ? IBD on Remicade, chronic n/v, Graves disease, marijuana use presented to ED c/o abd pain, n/v. She is followed outpatient by Dr. Hilaria Ota for her possible IBD, n/v. EGD 04/2022 was unremarkable. She reports persistent n/v and epigastric pain with 10-15 lbs weight loss over the last several months. Has had increased anxiety over the last several months. States she typically has several loose stools per day, sometimes with mucous and blood but not always. Today she felt very constipated with severe anal/rectal pain/pressure. She strained to have BM and passed small, hard pebble like stool mixed with some mucous/red blood. Significant other at bedside states she is extremely anxious regarding her health. She was seen at [facility] ED 7/20 for similar symptoms as today and was given benadryl, morphine, and carafate and was d/c home. She is scheduled for repeat colonoscopy in September. Last colonoscopy 08/2021 was WNL including normal random bx. CRP and sed rate are normal in ED. CT abd was unremarkable other than increased stool retention in the colon c/w constipation. She smokes marijuana several times a day for the last several years. Prior to this was a heavy etoh drinker but no longer drinks. Abd [place] 04/2022 was unremarkable. WBC 14.9 otherwise labs unremarkable.    Diagnoses:  Principal Problem:    Generalized abdominal pain       Review of Systems:   Review of Systems   Constitutional: Negative.    HENT: Negative.    Eyes: Negative.    Respiratory: Negative.    Cardiovascular: Negative.    Gastrointestinal: Positive for abdominal pain, constipation, nausea and vomiting.   Endocrine: Negative.    Genitourinary: Negative.    Musculoskeletal: Negative.    Neurological: Negative.    Hematological: Negative.    Psychiatric/Behavioral: Negative.         Past Medical History:  Past  Medical History:   Diagnosis Date   . Acute Crohn's disease (CMS/HCC)    . Graves disease    . IBS (irritable bowel syndrome)    . Irritable bowel syndrome    . Migraines         No family history on file.    Social History     Tobacco Use   . Smoking status: Former     Types: Cigarettes   . Smokeless tobacco: Never   Vaping Use   . Vaping Use: Never used   Substance Use Topics   . Alcohol use: Not Currently   . Drug use: Yes     Types: Marijuana        Allergies:  Allergies   Allergen Reactions   . Imitrex [Sumatriptan]    . Metoprolol Succinate      Other reaction(s): malaise, migraines   . Penicillins Hives   . Amoxicillin Rash       Medications:       Current Facility-Administered Medications:   .  acetaminophen (Tylenol) tablet 650 mg, 650 mg, oral, q6h PRN **OR** acetaminophen (Tylenol) solution 650 mg, 650 mg, oral, q6h PRN **OR** acetaminophen (Tylenol) suppository 650 mg, 650 mg, rectal, q6h PRN, Gulam Parihar, MD  .  albuterol 2.5 mg /3 mL (0.083 %) nebulizer solution 2.5 mg, 2.5 mg, nebulization, q4h PRN, Gulam Parihar, MD  .  morphine injection 4 mg, 4 mg, intravenous, q4h PRN, Mosetta Anis, MD, 4 mg  at 06/16/22 0959  .  multivitamin-minerals (Theragran-M) tablet 1 tablet, 1 tablet, oral, Daily, Gulam Parihar, MD  .  naloxone (Narcan) injection 0.1 mg, 0.1 mg, intravenous, PRN, Gulam Parihar, MD  .  ondansetron ODT (Zofran-ODT) disintegrating tablet 4 mg, 4 mg, oral, q8h PRN **OR** ondansetron (Zofran) injection 4 mg, 4 mg, intravenous, q8h PRN, Mosetta Anis, MD, 4 mg at 06/16/22 1000  .  pantoprazole (ProtoNix) injection 40 mg, 40 mg, intravenous, q24h SCH, Gulam Parihar, MD  .  polyethylene glycol (Glycolax) packet 17 g, 17 g, oral, BID PRN, Mosetta Anis, MD  .  propylthiouracil (PTU) tablet 25 mg, 25 mg, oral, BID, Gulam Parihar, MD  .  sodium chloride 0.9 % infusion, 125 mL/hr, intravenous, Continuous, Gulam Parihar, MD, Last Rate: 125 mL/hr at 06/16/22 1329, 125 mL/hr at 06/16/22 1329  .   traMADol (Ultram) tablet 50 mg, 50 mg, oral, q6h PRN, Mosetta Anis, MD    Physical Exam:  Vitals:   Vitals:    06/16/22 1330   BP:    Pulse: (!) 43   Resp: 14   Temp:    SpO2: 97%        Physical Exam  Constitutional:       General: She is not in acute distress.     Appearance: Normal appearance.   HENT:      Head: Normocephalic and atraumatic.      Mouth/Throat:      Mouth: Mucous membranes are moist.   Eyes:      Pupils: Pupils are equal, round, and reactive to light.   Cardiovascular:      Rate and Rhythm: Normal rate and regular rhythm.   Pulmonary:      Effort: Pulmonary effort is normal.   Abdominal:      General: Bowel sounds are normal. There is no distension.      Palpations: Abdomen is soft.      Tenderness: There is no abdominal tenderness.      Comments: Mild diffuse ttp   Musculoskeletal:         General: Normal range of motion.   Skin:     General: Skin is warm and dry.   Neurological:      General: No focal deficit present.      Mental Status: She is alert.   Psychiatric:         Mood and Affect: Mood normal.         Behavior: Behavior normal.          Imaging:  Procedure: CT ABDOMEN PELVIS W CONTRAST  06/16/2022 4:55 AM  Indications: RLQ pain, tenderness, r/o appy .    Technique: Dose reduction iterative reconstruction technique was utilized during this examination. Axial images were obtained with supplemental coronal and sagittal reconstructions. Examination was performed with intravenous contrast administration.    COMPARISON: 10/01/2016    Findings:   LUNG BASES: The lung bases are clear.  VISUALIZED HEART: Included cardiac chambers are normal in size without pericardial effusion or thickening.    LIVER: Normal  GALLBLADDER: Normal  BILIARY: No biliary dilatation is identified.  SPLEEN: Homogeneous-appearing normal size spleen.  PANCREAS: The pancreas is normal in appearance without ductal dilatation, mass or inflammation.  ADRENALS: The bilateral adrenal glands are normal in size, shape and  morphology.  KIDNEYS: The kidneys are normal in size, shape and configuration. No nephrolithiasis nor suspicious renal lesions are identified. Cortical-medullary distinction is maintained. There is no hydronephrosis.  STOMACH: The stomach is nondistended  in appearance. No mural nor rugal fold thickening is identified.  BOWEL: The duodenal bulb, sweep and ligament of Treitz position are anatomically situated. There is normal small bowel rotation. No bowel obstruction is identified. No inflammatory changes are noted. The distal and terminal ileum are unremarkable. Moderate colonic stool retention is seen within the colon. No large bowel obstruction or inflammation is seen.  APPENDIX: Normal-appearing appendix identified.    AORTA: The abdominal aorta is normal in caliber with minimal aortic atherosclerosis.   VEINS: Normal.  LYMPH NODES: No adenopathy by CT size criteria.    BLADDER: Unremarkable appearance of the bladder for distention. No focal mural thickening, diverticulum or calculus is noted.  REPRODUCTIVE ORGANS: Complex nabothian cyst at the cervix. Uterus and adnexa are unremarkable.  PELVIS: No free air nor free fluid. No pelvic adenopathy or mass.  MUSCULOSKELETAL: No acute nor suspicious osseous abnormalities.  BODY WALL: There is a small periumbilical fat-containing hernia.    IMPRESSION:  No acute intra-abdominal nor pelvic abnormality. A normal appendix is identified. There is increased stool retention within the colon consistent with constipation.    Tollie Eth 06/16/2022 6:13 AM  Labs:  Admission on 06/15/2022   Component Date Value Ref Range Status   . Sodium 06/15/2022 138  135 - 146 mmol/L Final   . Potassium 06/15/2022 4.0  3.6 - 5.2 mmol/L Final    Hemolyzed, may increase result   . Chloride 06/15/2022 108  98 - 110 mmol/L Final   . CO2 (Bicarbonate) 06/15/2022 23  20 - 32 mmol/L Final   . Anion Gap 06/15/2022 7  3 - 14 mmol/L Final   . BUN 06/15/2022 17  6 - 24 mg/dL Final   . Creatinine  78/29/5621 0.96  0.55 - 1.30 mg/dL Final   . eGFRcr 30/86/5784 77  >=60 mL/min/1.25m*2 Final   . Glucose 06/15/2022 147 (H)  70 - 110 mg/dL Final   . Fasting? 69/62/9528 Unknown   Final   . Calcium 06/15/2022 8.7  8.5 - 10.5 mg/dL Final   . AST 41/32/4401 23  6 - 42 U/L Final    Hemolyzed, may increase result   . ALT 06/15/2022 22  0 - 55 U/L Final   . Alkaline phosphatase 06/15/2022 80  30 - 130 U/L Final   . Protein, total 06/15/2022 7.3  6.0 - 8.4 g/dL Final   . Albumin 02/72/5366 3.6  3.2 - 5.0 g/dL Final   . Bilirubin, total 06/15/2022 0.2  0.2 - 1.2 mg/dL Final   . WBC 44/01/4741 14.9 (H)  4.0 - 11.0 K/uL Final   . RBC 06/15/2022 5.05  3.70 - 5.20 M/uL Final   . Hemoglobin 06/15/2022 13.7  11.0 - 16.0 g/dL Final   . Hematocrit 59/56/3875 42.5  32.0 - 47.0 % Final   . MCV 06/15/2022 84.2  80.0 - 100.0 fL Final   . MCH 06/15/2022 27.1  26.0 - 34.0 pg Final   . MCHC 06/15/2022 32.2  31.0 - 37.0 g/dL Final   . RDW-CV 64/33/2951 13.2  11.5 - 14.5 % Final   . RDW-SD 06/15/2022 40.5  35.0 - 51.0 fL Final   . Platelets 06/15/2022 305  150 - 400 K/uL Final   . MPV 06/15/2022 9.1  9.1 - 12.4 fL Final   . NRBC % 06/15/2022 0.0  0.0 - 0.0 % Final   . NRBC Absolute 06/15/2022 0.00  0.00 - 2.00 K/uL Final   . Extra Tube 06/15/2022 Hold for add-ons.  Final    Auto resulted.   Marland Kitchen Extra Tube 06/15/2022 Hold for add-ons.   Final    Auto resulted.   . Color, Ur 06/16/2022 Yellow  Yellow, Dark Yellow Final   . Clarity, Ur 06/16/2022 Clear  Clear Final   . Specific Gravity, Ur 06/16/2022 1.015  1.005 - 1.030 Final   . pH, Ur 06/16/2022 6.5  5.0 - 8.0 Final   . Protein,Ur 06/16/2022 Negative  Negative mg/dL Final   . Herminio Commons 28/41/3244 Negative  Negative mg/dL Final   . Ketones, Ur 11/25/7251 Negative  Negative mg/dL Final   . Bilirubin, Ur 06/16/2022 Negative  Negative Final   . Blood, Ur 06/16/2022 Negative  Negative Final   . Urobilinogen, Ur 06/16/2022 0.2  0.2-1.0 E.U./dl E.U./dl Final   . Nitrite, Ur 66/44/0347 Negative   Negative Final   . Leukocyte Esterase, Ur 06/16/2022 Trace (A)  Negative WBC/uL Final   . RBC, Ur 06/16/2022 1  0-4 cells/HPF cells/HPF Final   . WBC, Ur 06/16/2022 1  0-5 cells/HPF cells/HPF Final   . Epithelial Cells, UR 06/16/2022 1  0-5 cells/HPF cells/HPF Final   . Bacteria, Ur 06/16/2022 None Seen  None Seen Final   . Casts, Ur 06/16/2022 0  0-4 cells/LPF cells/LPF Final   . Neutrophils % 06/15/2022 59  % Final   . Lymphocytes % 06/15/2022 36  % Final   . Monocytes % 06/15/2022 5  % Final   . Neutrophils Absolute 06/15/2022 8.79 (H)  1.50 - 7.95 K/uL Final   . Lymphocytes Absolute 06/15/2022 5.36 (H)  0.70 - 4.00 K/uL Final   . Monocytes Absolute 06/15/2022 0.75  0.36 - 0.77 K/uL Final   . RBC Morphology 06/15/2022 Normal  Normal, Not Performed Final   . PLT Morphology 06/15/2022 Normal  Normal, Not Performed Final   . Total WBC Counted 06/15/2022 100   Final   . Lipase 06/15/2022 35  13 - 75 U/L Final   . hCG Qualitative Serum 06/15/2022 Negative   Final   . CRP 06/15/2022 <0.29  0.29 - 0.80 mg/dL Final   . Sed Rate 42/59/5638 20  0 - 20 mm/hr Final   . Protime 06/15/2022 10.9  9.3 - 11.6 seconds Final   . INR 06/15/2022 1.00  See Interpretive Comment Final    RECOMMENDED INR WITH WARFARIN/COUMADIN THERAPY:                                     INR: 2.0-3.0   Deep vein thrombosis           Atrial Fibrillation   Tissue Prosthetic Valve   Stroke Prevention    INR:2.5-3.5   Mechanical Prosthetic Valve         and Recurrent Systemic Embolism     . aPTT 06/15/2022 30  23 - 32 Seconds Final   . Extra Tube 06/16/2022 Hold for add-ons.   Final    Auto resulted.   Marland Kitchen Extra Tube 06/16/2022 Hold for add-ons.   Final    Auto resulted.       Assessment and Plan:  41 yo female with history of ? IBD on Remicade, chronic n/v, Graves disease, marijuana use presented to ED c/o abd pain, n/v. No e/o acute IBD flare with normal CT, CRP, sed rate. Extensive work up thus far re: n/v has been unremarkable. Highly suspicious for  cannabis induced hyperemesis which I expressed  to patient and encouraged complete cessation. Constipation clinically and on CT noted today may be contributing to overall symptoms.     -marijuana cessation, may take up to 6 months of abstinence to see improvement in symptoms  -can trial coenzyme q10, l-carnitine, and riboflavin as outpatient  -suggest bowel regimen inpatient to help with acute constipation - miralax 17 g daily and stool softener, consider dulcolax supp if no results  -f/u outpatient with Dr. Hilaria Ota re: ? IBD, we will arrange - has colonoscopy scheduled for September and will see if our office can move up per patient request

## 2022-06-17 LAB — COMPREHENSIVE METABOLIC PANEL
ALT: 19 U/L (ref 0–55)
AST: 10 U/L (ref 6–42)
Albumin: 3 g/dL — ABNORMAL LOW (ref 3.2–5.0)
Alkaline phosphatase: 58 U/L (ref 30–130)
Anion Gap: 9 mmol/L (ref 3–14)
BUN: 6 mg/dL (ref 6–24)
Bilirubin, total: 0.5 mg/dL (ref 0.2–1.2)
CO2 (Bicarbonate): 23 mmol/L (ref 20–32)
Calcium: 8.4 mg/dL — ABNORMAL LOW (ref 8.5–10.5)
Chloride: 112 mmol/L — ABNORMAL HIGH (ref 98–110)
Creatinine: 0.63 mg/dL (ref 0.55–1.30)
Glucose: 85 mg/dL (ref 70–110)
Potassium: 3.6 mmol/L (ref 3.6–5.2)
Protein, total: 6.2 g/dL (ref 6.0–8.4)
Sodium: 144 mmol/L (ref 135–146)
eGFRcr: 116 mL/min/{1.73_m2} (ref >=60)

## 2022-06-17 LAB — CBC
Hematocrit: 40.5 % (ref 32.0–47.0)
Hemoglobin: 13 g/dL (ref 11.0–16.0)
MCH: 26.9 pg (ref 26.0–34.0)
MCHC: 32.1 g/dL (ref 31.0–37.0)
MCV: 83.9 fL (ref 80.0–100.0)
MPV: 9 fL — ABNORMAL LOW (ref 9.1–12.4)
NRBC %: 0 % (ref 0.0–0.0)
NRBC Absolute: 0 10*3/uL (ref 0.00–2.00)
Platelets: 267 10*3/uL (ref 150–400)
RBC: 4.83 M/uL (ref 3.70–5.20)
RDW-CV: 13.2 % (ref 11.5–14.5)
RDW-SD: 40.7 fL (ref 35.0–51.0)
WBC: 10.2 10*3/uL (ref 4.0–11.0)

## 2022-06-17 LAB — TSH WITH REFLEX: TSH: 0.459 u[IU]/mL (ref 0.358–3.740)

## 2022-06-17 MED ORDER — DEXTROSE 5 % AND 0.45 % NACL IV BOLUS bolus 1,000 mL
5-0.45 | Freq: Once | INTRAVENOUS | Status: AC
Start: 2022-06-17 — End: 2022-06-17
  Administered 2022-06-17: 19:00:00 1000 mL via INTRAVENOUS

## 2022-06-17 MED ORDER — multivitamin-minerals (Theragran-M) 9 mg iron-400 mcg tablet
9 | ORAL_TABLET | Freq: Every day | ORAL | 0 refills | 90.00000 days | Status: DC
Start: 2022-06-17 — End: 2022-06-17

## 2022-06-17 MED ORDER — prochlorperazine (Compazine) injection 10 mg
10 | Freq: Once | INTRAMUSCULAR | Status: AC | PRN
Start: 2022-06-17 — End: 2022-06-17
  Administered 2022-06-17: 09:00:00 10 mg via INTRAVENOUS

## 2022-06-17 MED ORDER — traMADol (Ultram) 50 mg tablet
50 | ORAL_TABLET | Freq: Four times a day (QID) | ORAL | 0 refills | 11.00000 days | Status: AC | PRN
Start: 2022-06-17 — End: 2022-06-20

## 2022-06-17 MED FILL — MORPHINE 4 MG/ML INTRAVENOUS SOLUTION WRAPPER: 4 4 mg/mL | INTRAVENOUS | Qty: 1

## 2022-06-17 MED FILL — MELATONIN 3 MG TABLET: 3 3 mg | ORAL | Qty: 1

## 2022-06-17 MED FILL — PROCHLORPERAZINE EDISYLATE 10 MG/2 ML (5 MG/ML) INJECTION SOLUTION: 10 10 mg/2 mL (5 mg/mL) | INTRAMUSCULAR | Qty: 2

## 2022-06-17 MED FILL — TRAMADOL 50 MG TABLET: 50 50 mg | ORAL | Qty: 1

## 2022-06-17 MED FILL — MULTIVITAMIN-IRON 9 MG-FOLIC ACID 400 MCG-CALCIUM AND MINERALS TABLET: 9 9 mg iron-400 mcg | ORAL | Qty: 1

## 2022-06-17 MED FILL — ONDANSETRON HCL (PF) 4 MG/2 ML INJECTION SOLUTION: 4 4 mg/2 mL | INTRAMUSCULAR | Qty: 2

## 2022-06-17 MED FILL — PANTOPRAZOLE 40 MG INTRAVENOUS SOLUTION: 40 40 mg | INTRAVENOUS | Qty: 40

## 2022-06-17 NOTE — Other (Signed)
Patient Education   Table of Contents     Bradycardia, Adult     To view videos and all your education online visit,   [email or web address]   or scan this QR code with your smartphone.   Access to this content will expire in one year.                    Bradycardia, Adult     Bradycardia is a slower-than-normal heartbeat. A normal resting heart rate for an adult ranges from 60 to 100 beats per minute. With bradycardia, the resting heart rate is less than 60 beats per minute.   Bradycardia can prevent enough oxygen from reaching certain areas of your body when you are active. It can be serious if it keeps enough oxygen from reaching your brain and other parts of your body. Bradycardia is not a problem for everyone. For some healthy adults, a slow resting heart rate is normal.   What are the causes?       This condition may be caused by:     A problem with the heart, including:     A problem with the heart's electrical system, such as a heart block. With a heart block, electrical signals between the chambers of the heart are partially or completely blocked, so they are not able to work as they should.     A problem with the heart's natural pacemaker (sinus node).     Heart disease.     A heart attack.     Heart damage.     Lyme disease.     A heart infection.     A heart condition that is present at birth (congenital heart defect).     Certain medicines that treat heart conditions.     Certain conditions, such as hypothyroidism and obstructive sleep apnea.     Problems with the balance of chemicals and other substances, like potassium, in the blood.     Trauma.     Radiation therapy.       What increases the risk?    You are more likely to develop this condition if you:     Are age 44 or older.     Have high blood pressure (hypertension), high cholesterol (hyperlipidemia), or diabetes.     Drink heavily, use tobacco or nicotine products, or use drugs.     What are the signs or symptoms?    Symptoms of this  condition include:     Light-headedness.     Feeling faint or fainting.     Fatigue and weakness.     Trouble with activity or exercise.     Shortness of breath.     Chest pain (angina).     Drowsiness.     Confusion.     Dizziness.     How is this diagnosed?    This condition may be diagnosed based on:     Your symptoms.     Your medical history.     A physical exam.      During the exam, your health care provider will listen to your heartbeat and check your pulse. To confirm the diagnosis, your health care provider may order tests, such as:     Blood tests.     An electrocardiogram (ECG). This test records the heart's electrical activity. The test can show how fast your heart is beating and whether the heartbeat is steady.     A test in which  you wear a portable device (event recorder or Holter monitor) to record your heart's electrical activity while you go about your day.     An exercise test.     How is this treated?    Treatment for this condition depends on the cause of the condition and how severe your symptoms are. Treatment may involve:     Treatment of the underlying condition.     Changing your medicines or how much medicine you take.     Having a small, battery-operated device called a pacemaker implanted under the skin. When bradycardia occurs, this device can be used to increase your heart rate and help your heart beat in a regular rhythm.     Follow these instructions at home:   Lifestyle     Manage any health conditions that contribute to bradycardia as told by your health care provider.     Follow a heart-healthy diet. A nutrition specialist (dietitian) can help educate you about healthy food options and changes.     Follow an exercise program that is approved by your health care provider.     Maintain a healthy weight.     Try to reduce or manage your stress, such as with yoga or meditation. If you need help reducing stress, ask your health care provider.    Do not  use any products that contain  nicotine or tobacco. These products include cigarettes, chewing tobacco, and vaping devices, such as e-cigarettes. If you need help quitting, ask your health care provider.    Do not  use illegal drugs.     Alcohol use  If you drink alcohol:     Limit how much you have to:     0?1 drink a day for women who are not pregnant.     0?2 drinks a day for men.     Know how much alcohol is in a drink. In the [place] one drink equals one 12 oz bottle of beer (355 mL), one 5 oz glass of wine (148 mL), or one 1? oz glass of hard liquor (44 mL).     General instructions     Take over-the-counter and prescription medicines only as told by your health care provider.     Keep all follow-up visits. This is important.     How is this prevented?    In some cases, bradycardia may be prevented by:     Treating underlying medical problems.     Stopping behaviors or medicines that can trigger the condition.     Contact a health care provider if:       You feel light-headed or dizzy.     You almost faint.     You feel weak or are easily fatigued during physical activity.     You experience confusion or have memory problems.     Get help right away if:       You faint.     You have chest pains or an irregular heartbeat (palpitations).     You have trouble breathing.     These symptoms may represent a serious problem that is an emergency. Do not wait to see if the symptoms will go away. Get medical help right away. Call your local emergency services (911 in the [place] Do not drive yourself to the hospital.   Summary       Bradycardia is a slower-than-normal heartbeat. With bradycardia, the resting heart rate is less than 60 beats per minute.  Treatment for this condition depends on the cause.     Manage any health conditions that contribute to bradycardia as told by your health care provider.    Do not  use any products that contain nicotine or tobacco. These products include cigarettes, chewing tobacco, and vaping devices, such as  e-cigarettes.     Keep all follow-up visits. This is important.     This information is not intended to replace advice given to you by your health care provider. Make sure you discuss any questions you have with your health care provider.     Document Released: 08/01/2002 Document Revised: 03/02/2021 Document Reviewed: 03/02/2021     Elsevier Patient Education ? 2023 [employer]

## 2022-06-17 NOTE — Care Plan (Signed)
Problem: Adult Inpatient Plan of Care  Goal: Plan of Care Review  Outcome: Ongoing, Progressing  Flowsheets (Taken 06/17/2022 0507)  Progress: no change  Outcome Evaluation: Pt still c/o 10/10 abd pain more on the right side. Relieved by prn morphine. Pt had episodes of vomiting and nausea. Zofran not really effective. NP ordered compazine x1. Pt remains bradycardic going to as low 30s but mostly in the 40s. Pt reports on and off palpitations as well. There is a cardiology consult that was placed. EKG also done.  Goal: Patient-Specific Goal (Individualized)  Outcome: Ongoing, Progressing  Goal: Absence of Hospital-Acquired Illness or Injury  Outcome: Ongoing, Progressing  Goal: Optimal Comfort and Wellbeing  Outcome: Ongoing, Progressing  Goal: Readiness for Transition of Care  Outcome: Ongoing, Progressing   Goal Outcome Evaluation:     Progress: no change  Outcome Evaluation: Pt still c/o 10/10 abd pain more on the right side. Relieved by prn morphine. Pt had episodes of vomiting and nausea. Zofran not really effective. NP ordered compazine x1. Pt remains bradycardic going to as low 30s but mostly in the 40s. Pt reports on and off palpitations as well. There is a cardiology consult that was placed. EKG also done.

## 2022-06-17 NOTE — Consults (Signed)
[facility]      REASON FOR CONSULT: Bradycardia       HISTORY OF PRESENT ILLNESS:  41 y.o. year old woman with 1 episode of atrial fibrillation approximately 4 years ago, presents with abdominal discomfort.  She tells me that she does have abdominal discomfort fairly regularly, being investigated.  Typically her symptoms are on the right lower quadrant, but has more recently also had some epigastric discomfort which was investigated with upper endoscopy.  She also has a very focal left parasternal region of sharp pain.  She was admitted for further evaluation of abdominal symptoms and was noted to be bradycardic.  She has a smart watch and tells me that bradycardia has not really been an issue in the past.  She does take medications for hypothyroidism.  No history of shortness of breath or syncope.  She does have occasional lightheadedness.    PHYSICAL EXAMINATION:  Visit Vitals  BP 103/62 (BP Location: Left arm, Patient Position: Lying)   Pulse (!) 42   Temp 36.6 C (97.9 F) (Oral)   Resp 14     Body mass index is 31.04 kg/m.   General: alert and oriented and in no distress   Neck: Jugular venous pressure was normal.  Carotids are 2+ with no bruits.   Lungs: Clear to auscultation, with no accessory muscle use   Cardiac: Normal S1 and S2   Abdomen: Soft, nontender   Extremities: Pedal pulse are 2+.  No edema    DATA:  . Labs: Sodium 144, potassium 3.6, creatinine 0.6, hematocrit 40.5, TSH July 24 0.5  . She feels fine she feels these episodes  . EKG reviewed: Sinus at 50 bpm.  It is funny yet that is take some scale      IMPRESSION:  . Asymptomatic sinus bradycardia.  With mild physical activity heart rate does improve.  No clear associated symptoms, and most likely related to increased vagal tone in the setting of abdominal discomfort  . History of atrial fibrillation in the past, no recent recurrence  . Noncardiac chest pain    RECOMMENDATIONS:   At this point, do not recommend further  cardiac work-up as her sinus bradycardia is related to increased abdominal vagal tone, and should resolve once her abdominal issues have settled.   Recommend aggressive hydration   She has follow-up with Dr. Illene BolusSanchorawala later this year   Echocardiogram is not indicated.    No further inpatient cardiac work-up recommended.  Please call with any questions.   PAST MEDICAL HISTORY:  . Episode of atrial fibrillation several years ago  . IBS  . Graves' disease, hyperthyroidism    MEDICATIONS:  multivitamin-minerals, 1 tablet, oral, Daily  pantoprazole, 40 mg, intravenous, q24h SCH  propylthiouracil, 25 mg, oral, BID      sodium chloride, 125 mL/hr, Last Rate: 125 mL/hr (06/17/22 0438)        ALLERGIES:  Allergies   Allergen Reactions   . Imitrex [Sumatriptan]    . Metoprolol Succinate      Other reaction(s): malaise, migraines   . Penicillins Hives   . Amoxicillin Rash       FAMILY HISTORY: No premature coronary artery disease or sudden death in first degree relatives    SOCIAL HISTORY: Non-smoking.  Marijuana use.     Bradycardia
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
## 2022-06-17 NOTE — Nursing Note (Signed)
Pt reported nausea x2 during shift.  Medicated as requested with good effect.  Requested Morphine x1 with good effect.  Pt remains bradycardic on monitor.  Re-pleating fluids with 250/hr D% w/ 0.45NS.  Pt to DC after bolus is finished.  No other complications arose during shift.  Pt not hungry and didn't want to eat.  Cleared by cardiology.  Will continue to monitor until DC.

## 2022-06-17 NOTE — Nursing Note (Signed)
Went over discharge instructions with pt and answered all questions. Pt verbalized understanding. Pt's IV removed from left arm. Pt tolerated well. Pt was walked out by PCT. Pt ambulates with steady gait. Pt was discharged at 8:10pm.

## 2022-06-17 NOTE — Discharge Instructions (Signed)
Resume previous diet

## 2022-06-17 NOTE — Discharge Instructions (Signed)
Activity as tolerated
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
## 2022-06-17 NOTE — Progress Notes (Signed)
[facility]  [phone]    PROGRESS NOTE    Today's Date: 06/17/2022  MRN: [id number]  Name: Tiffany Leach  DOB: [birth year]    Subjective   Subjective:  Feels OK. Slight nausea today but no vomiting or diarrhea. Abd pain improved. Appetite not great but tolerated a shake today. Wants to go home.     Objective   Physical Exam  Constitutional:       General: She is not in acute distress.     Appearance: Normal appearance.   HENT:      Head: Normocephalic and atraumatic.      Mouth/Throat:      Mouth: Mucous membranes are moist.   Eyes:      Pupils: Pupils are equal, round, and reactive to light.   Cardiovascular:      Rate and Rhythm: Normal rate and regular rhythm.   Pulmonary:      Effort: Pulmonary effort is normal.   Abdominal:      General: Bowel sounds are normal. There is no distension.      Palpations: Abdomen is soft.      Tenderness: There is no abdominal tenderness.   Musculoskeletal:         General: Normal range of motion.   Skin:     General: Skin is warm and dry.   Neurological:      General: No focal deficit present.      Mental Status: She is alert.   Psychiatric:         Mood and Affect: Mood normal.         Behavior: Behavior normal.         Last Recorded Vitals  Blood pressure 137/75, pulse 55, temperature 36.6 C (97.8 F), temperature source Oral, resp. rate 18, height 1.524 m, weight 72.1 kg, last menstrual period 06/05/2022, SpO2 100 %.    Labs:  Admission on 06/15/2022   Component Date Value Ref Range Status   . Sodium 06/15/2022 138  135 - 146 mmol/L Final   . Potassium 06/15/2022 4.0  3.6 - 5.2 mmol/L Final    Hemolyzed, may increase result   . Chloride 06/15/2022 108  98 - 110 mmol/L Final   . CO2 (Bicarbonate) 06/15/2022 23  20 - 32 mmol/L Final   . Anion Gap 06/15/2022 7  3 - 14 mmol/L Final   . BUN 06/15/2022 17  6 - 24 mg/dL Final   . Creatinine 78/29/5621 0.96  0.55 - 1.30 mg/dL Final   . eGFRcr 30/86/5784 77  >=60 mL/min/1.64m*2 Final   . Glucose 06/15/2022 147 (H)  70  - 110 mg/dL Final   . Fasting? 69/62/9528 Unknown   Final   . Calcium 06/15/2022 8.7  8.5 - 10.5 mg/dL Final   . AST 41/32/4401 23  6 - 42 U/L Final    Hemolyzed, may increase result   . ALT 06/15/2022 22  0 - 55 U/L Final   . Alkaline phosphatase 06/15/2022 80  30 - 130 U/L Final   . Protein, total 06/15/2022 7.3  6.0 - 8.4 g/dL Final   . Albumin 02/72/5366 3.6  3.2 - 5.0 g/dL Final   . Bilirubin, total 06/15/2022 0.2  0.2 - 1.2 mg/dL Final   . WBC 44/01/4741 14.9 (H)  4.0 - 11.0 K/uL Final   . RBC 06/15/2022 5.05  3.70 - 5.20 M/uL Final   . Hemoglobin 06/15/2022 13.7  11.0 - 16.0 g/dL Final   . Hematocrit 59/56/3875 42.5  32.0 - 47.0 % Final   . MCV 06/15/2022 84.2  80.0 - 100.0 fL Final   . MCH 06/15/2022 27.1  26.0 - 34.0 pg Final   . MCHC 06/15/2022 32.2  31.0 - 37.0 g/dL Final   . RDW-CV 16/08/9603 13.2  11.5 - 14.5 % Final   . RDW-SD 06/15/2022 40.5  35.0 - 51.0 fL Final   . Platelets 06/15/2022 305  150 - 400 K/uL Final   . MPV 06/15/2022 9.1  9.1 - 12.4 fL Final   . NRBC % 06/15/2022 0.0  0.0 - 0.0 % Final   . NRBC Absolute 06/15/2022 0.00  0.00 - 2.00 K/uL Final   . Extra Tube 06/15/2022 Hold for add-ons.   Final    Auto resulted.   Marland Kitchen Extra Tube 06/15/2022 Hold for add-ons.   Final    Auto resulted.   . Color, Ur 06/16/2022 Yellow  Yellow, Dark Yellow Final   . Clarity, Ur 06/16/2022 Clear  Clear Final   . Specific Gravity, Ur 06/16/2022 1.015  1.005 - 1.030 Final   . pH, Ur 06/16/2022 6.5  5.0 - 8.0 Final   . Protein,Ur 06/16/2022 Negative  Negative mg/dL Final   . Herminio Commons 54/07/8118 Negative  Negative mg/dL Final   . Ketones, Ur 14/78/2956 Negative  Negative mg/dL Final   . Bilirubin, Ur 06/16/2022 Negative  Negative Final   . Blood, Ur 06/16/2022 Negative  Negative Final   . Urobilinogen, Ur 06/16/2022 0.2  0.2-1.0 E.U./dl E.U./dl Final   . Nitrite, Ur 21/30/8657 Negative  Negative Final   . Leukocyte Esterase, Ur 06/16/2022 Trace (A)  Negative WBC/uL Final   . RBC, Ur 06/16/2022 1  0-4 cells/HPF  cells/HPF Final   . WBC, Ur 06/16/2022 1  0-5 cells/HPF cells/HPF Final   . Epithelial Cells, UR 06/16/2022 1  0-5 cells/HPF cells/HPF Final   . Bacteria, Ur 06/16/2022 None Seen  None Seen Final   . Casts, Ur 06/16/2022 0  0-4 cells/LPF cells/LPF Final   . Neutrophils % 06/15/2022 59  % Final   . Lymphocytes % 06/15/2022 36  % Final   . Monocytes % 06/15/2022 5  % Final   . Neutrophils Absolute 06/15/2022 8.79 (H)  1.50 - 7.95 K/uL Final   . Lymphocytes Absolute 06/15/2022 5.36 (H)  0.70 - 4.00 K/uL Final   . Monocytes Absolute 06/15/2022 0.75  0.36 - 0.77 K/uL Final   . RBC Morphology 06/15/2022 Normal  Normal, Not Performed Final   . PLT Morphology 06/15/2022 Normal  Normal, Not Performed Final   . Total WBC Counted 06/15/2022 100   Final   . Lipase 06/15/2022 35  13 - 75 U/L Final   . hCG Qualitative Serum 06/15/2022 Negative   Final   . CRP 06/15/2022 <0.29  0.29 - 0.80 mg/dL Final   . Sed Rate 84/69/6295 20  0 - 20 mm/hr Final   . Protime 06/15/2022 10.9  9.3 - 11.6 seconds Final   . INR 06/15/2022 1.00  See Interpretive Comment Final    RECOMMENDED INR WITH WARFARIN/COUMADIN THERAPY:                                     INR: 2.0-3.0   Deep vein thrombosis           Atrial Fibrillation   Tissue Prosthetic Valve   Stroke Prevention    INR:2.5-3.5   Mechanical  Prosthetic Valve         and Recurrent Systemic Embolism     . aPTT 06/15/2022 30  23 - 32 Seconds Final   . Extra Tube 06/16/2022 Hold for add-ons.   Final    Auto resulted.   Marland Kitchen. Extra Tube 06/16/2022 Hold for add-ons.   Final    Auto resulted.   . Sodium 06/17/2022 144  135 - 146 mmol/L Final   . Potassium 06/17/2022 3.6  3.6 - 5.2 mmol/L Final   . Chloride 06/17/2022 112 (H)  98 - 110 mmol/L Final   . CO2 (Bicarbonate) 06/17/2022 23  20 - 32 mmol/L Final   . Anion Gap 06/17/2022 9  3 - 14 mmol/L Final   . BUN 06/17/2022 6  6 - 24 mg/dL Final   . Creatinine 16/10/960407/26/2023 0.63  0.55 - 1.30 mg/dL Final   . eGFRcr 54/09/811907/26/2023 116  >=60 mL/min/1.6191m*2 Final   .  Glucose 06/17/2022 85  70 - 110 mg/dL Final   . Fasting? 14/78/295607/26/2023 Unknown   Final   . Calcium 06/17/2022 8.4 (L)  8.5 - 10.5 mg/dL Final   . AST 21/30/865707/26/2023 10  6 - 42 U/L Final   . ALT 06/17/2022 19  0 - 55 U/L Final   . Alkaline phosphatase 06/17/2022 58  30 - 130 U/L Final   . Protein, total 06/17/2022 6.2  6.0 - 8.4 g/dL Final   . Albumin 84/69/629507/26/2023 3.0 (L)  3.2 - 5.0 g/dL Final   . Bilirubin, total 06/17/2022 0.5  0.2 - 1.2 mg/dL Final   . WBC 28/41/324407/26/2023 10.2  4.0 - 11.0 K/uL Final   . RBC 06/17/2022 4.83  3.70 - 5.20 M/uL Final   . Hemoglobin 06/17/2022 13.0  11.0 - 16.0 g/dL Final   . Hematocrit 01/02/725307/26/2023 40.5  32.0 - 47.0 % Final   . MCV 06/17/2022 83.9  80.0 - 100.0 fL Final   . MCH 06/17/2022 26.9  26.0 - 34.0 pg Final   . MCHC 06/17/2022 32.1  31.0 - 37.0 g/dL Final   . RDW-SD 66/44/034707/26/2023 40.7  35.0 - 51.0 fL Final   . RDW-CV 06/17/2022 13.2  11.5 - 14.5 % Final   . Platelets 06/17/2022 267  150 - 400 K/uL Final   . MPV 06/17/2022 9.0 (L)  9.1 - 12.4 fL Final   . NRBC % 06/17/2022 0.0  0.0 - 0.0 % Final   . NRBC Absolute 06/17/2022 0.00  0.00 - 2.00 K/uL Final   . TSH 06/15/2022 0.459  0.358 - 3.740 uIU/mL Final        Imaging:         Assessment/Plan   Assessment and Plan:  N/v, abd pain. Clinically improving today with normal work up thus far. Already has outpatient colonoscopy scheduled and we will arrange outpatient f/u with our office.    -PPI BID  -carafate QID  -Miralax 17 g daily PRN  -antiemetics as needed  -again encouraged complete marijuana cessation    Ok for d/c from GI standpoint, will sign off         Alphonzo SeveranceMELISSA , NP

## 2022-06-18 LAB — LYME ANTIBODIES W/ RFLX IB: Lyme Ab Screen: <0.9 {index}

## 2022-06-19 LAB — T3, FREE: T3, Free: 3.2 pg/mL (ref 2.3–4.2)

## 2022-06-22 LAB — BMP (EXT)
Anion Gap (EXT): 9 mmol/L (ref 3–17)
BUN (EXT): 9 mg/dL (ref 8–25)
CO2 (EXT): 24 mmol/L (ref 23–32)
CalciumCalcium (EXT): 9.1 mg/dL (ref 8.5–10.5)
Chloride (EXT): 106 mmol/L (ref 98–108)
Creatinine (EXT): 0.66 mg/dL (ref 0.60–1.50)
GFR Estimated (Calc) (EXT): 114 mL/min/{1.73_m2} (ref >59)
Glucose (EXT): 83 mg/dL (ref 70–110)
Potassium (EXT): 4.2 mmol/L (ref 3.4–5.0)
Sodium (EXT): 139 mmol/L (ref 135–145)

## 2022-06-23 ENCOUNTER — Inpatient Hospital Stay: Admit: 2022-06-23 | Discharge: 2022-06-23 | Payer: MEDICAID | Attending: Anesthesiology | Primary: Internal Medicine

## 2022-06-23 DIAGNOSIS — R1112 Projectile vomiting: Secondary | ICD-10-CM

## 2022-06-23 DIAGNOSIS — K523 Indeterminate colitis: Secondary | ICD-10-CM

## 2022-06-23 LAB — POCT PREGNANCY, URINE: POC hCG Qual, Ur: NEGATIVE

## 2022-06-23 MED ORDER — lactated Ringer's infusion
INTRAVENOUS | Status: DC
Start: 2022-06-23 — End: 2022-06-24
  Administered 2022-06-23: 14:00:00 50 mL/h via INTRAVENOUS

## 2022-06-23 MED ORDER — atropine injection
0.4 | INTRAMUSCULAR | Status: DC | PRN
Start: 2022-06-23 — End: 2022-06-23
  Administered 2022-06-23: 15:00:00 .2 via INTRAVENOUS

## 2022-06-23 MED ORDER — propofol (Diprivan) infusion 10 mg/mL
10 | INTRAVENOUS | Status: DC | PRN
Start: 2022-06-23 — End: 2022-06-23
  Administered 2022-06-23 (×3): 50 via INTRAVENOUS

## 2022-06-23 MED ORDER — glycopyrrolate (Robinul) injection
0.2 | INTRAMUSCULAR | Status: DC | PRN
Start: 2022-06-23 — End: 2022-06-23
  Administered 2022-06-23: 15:00:00 .2 via INTRAVENOUS

## 2022-06-23 NOTE — Other (Signed)
Patient Education   Table of Contents     Colon Biopsy     Colonoscopy, Adult, Care After     Monitored Anesthesia Care, Care After     To view videos and all your education online visit,   [email or web address]   or scan this QR code with your smartphone.   Access to this content will expire in one year.                    Colon Biopsy     A colon biopsy is a procedure to remove tissue samples from the colon, which is part of the large intestine. The tissue that is removed can then be looked at under a microscope for disease.    The biopsy will be done during a colonoscopy. A colonoscopy involves inserting a colonoscope into the opening between the buttocks (anus) and then into the rectum, colon, and other parts of the large intestine. A colonoscope is a flexible tube that has oil or gel on it (is lubricated) and a camera on the end. The scope allows your health care provider to see the inside of your colon. A biopsy may be done to help screen for or diagnose medical problems. These include:     Tumors.     A type of abnormal growth called polyps.     Inflammation.     Areas of bleeding.     Tell a health care provider about:       Any allergies you have.     All medicines you are taking, including vitamins, herbs, eye drops, creams, and over-the-counter medicines.     Any problems you or family members have had with anesthetic medicines.     Any blood disorders you have.     Any surgeries you have had.     Any medical conditions you have.     Whether you are pregnant or may be pregnant.     What are the risks?    Generally, this is a safe procedure. However, problems may occur, including:     Infection.     Bleeding.     Allergic reactions to medicines.     Damage to nearby structures or organs.     A hole (perforation) in the colon that must be fixed with surgery.     What happens before the procedure?   Eating and drinking restrictions  Follow instructions from your health care provider about eating  and drinking, which may include:     Several days before the procedure - follow a low-fiber diet. Avoid nuts, seeds, dried fruit, raw fruits, and vegetables.     1?3 days before the procedure - follow a clear liquid diet. This diet is limited to liquids or certain soft or frozen foods that you can see through. These liquids or foods include clear broth or bouillon, black coffee or plain tea, clear fruit juice, clear soft drinks or sports drinks, gelatin dessert, and flavored ice. Avoid any liquids, gelatins, or frozen items that contain red or purple dye.     On the day of the procedure - do not eat or drink anything during the 2 hours before the procedure, or within the time period that your health care provider recommends.     Bowel prep  If you were prescribed an oral bowel prep to clean out your colon:     Take it as told by your health care provider. Starting the day before your  procedure, you will need to drink a large amount of medicated liquid. The liquid will cause you to have multiple loose bowel movements until your stool (feces) is almost clear or light green.     If your skin or the anus gets irritated from diarrhea, you may use these to relieve the irritation:     Medicated wipes, such as adult wet wipes with aloe and vitamin E.     A skin-soothing product such as petroleum jelly.     If you vomit while drinking the bowel prep, take a break for up to 60 minutes and then begin the bowel prep again. Call your health care provider if you continue to vomit and cannot take the bowel prep without vomiting.   Medicines  Ask your health care provider about:     Changing or stopping your regular medicines. This is especially important if you are taking diabetes medicines, arthritis medicines, or blood thinners.     Taking medicines such as aspirin and ibuprofen. These medicines can thin your blood. Do not  take these medicines unless your health care provider tells you to take them.     Taking over-the-counter  medicines, vitamins, herbs, and supplements.     When to stop iron therapy given by mouth or IV before the colonoscopy and biopsy (usually 4?5 days before).     General instructions     Plan to have a responsible adult take you home from the hospital or clinic.     Plan to have a responsible adult care for you for the time you are told after you leave the hospital or clinic. This is important.     Ask your health care provider how your biopsy site will be marked.     Ask your health care provider what steps will be taken to prevent infection.     Ask your health care provider for what time period you should avoid tobacco use, including chewing tobacco.     What happens during the procedure?          You will be asked to lie on your side with your knees bent toward your chest.     An IV will be inserted into one of your veins.     You will be given a medicine to help you relax (sedative).     Your health care provider will lubricate the colonoscope.     The colonoscope will be gently eased through the rectum and moved to the area of abnormal tissue.     Air will be delivered into the colon to keep it open. You may feel some pressure or cramping.     The camera on the colonoscope may be used to take images during the procedure.     Surgical instruments will be inserted through the colonoscope to perform the biopsy.     One or more tissue samples will be clipped from your colon. The sample or samples will be sent to the lab for testing.     The procedure may vary among health care providers and hospitals.     What happens after the procedure?       Your blood pressure, heart rate, breathing rate, and blood oxygen level will be monitored until you leave the hospital or clinic.     You may have cramping, bloating, and gas in your abdomen.     There may be some bleeding from the anus.     If you were given  a sedative during the procedure, it can affect you for several hours. Do not  drive or operate machinery until your  health care provider says that it is safe.     It is up to you to get the results of your procedure. Ask your health care provider, or the department that is doing the procedure, when your results will be ready.     Summary       A colon biopsy is a procedure to remove tissue samples from the colon, which is part of the large intestine. The tissue that is removed can then be looked at under a microscope for disease.     Before the procedure, take the oral bowel prep as told by your health care provider to clean out your colon, if the prep was prescribed for you.     Plan to have a responsible adult take you home from the hospital or clinic.     Plan to have a responsible adult care for you for the time you are told after you leave the hospital or clinic. This is important.     This information is not intended to replace advice given to you by your health care provider. Make sure you discuss any questions you have with your health care provider.     Document Released: 04/20/2017 Document Revised: 02/28/2020 Document Reviewed: 02/28/2020     Elsevier Patient Education ? [place].         Colonoscopy, Adult, Care After     After a colonoscopy, it is common to have:     A small amount of blood in your poop (stool) for 24 hours.     Some gas.     Mild cramping or bloating in your belly (abdomen).     Follow these instructions at home:   Your doctor may give you more instructions. If you have problems, contact your doctor.   Eating and drinking        Drink enough fluid to keep your pee (urine) pale yellow.     Follow instructions from your doctor about what you cannot eat or drink.     Return to your normal diet as told by your doctor. Avoid heavy or fried foods that are hard to digest.     Activity     Rest as told by your doctor.     Get up to take short walks every 1 to 2 hours. Ask for help if you feel weak or unsteady.     Return to your normal activities when your doctor says that it is safe.     To help  cramping and bloating:        Try walking around.     If told, put heat on your belly. Do this as told by your doctor. Use the heat source that your doctor recommends, such as a moist heat pack or a heating pad.     Place a towel between your skin and the heat source.     Leave the heat on for 20?30 minutes.     Take off the heat if your skin turns bright red. This is very important. If you cannot feel pain, heat, or cold, you have a greater risk of getting burned.       General instructions     If you were given a sedative during your procedure, do not drive or use machines until your doctor says that it is safe. A sedative is  a medicine that helps you relax.     For the first 24 hours after the procedure:    Do not  sign important documents.    Do not  drink alcohol.     Do your daily activities more slowly than normal.     Eat foods that are soft and easy to digest.     Take over-the-counter and prescription medicines only as told by your doctor.     Keep all follow-up visits.       Contact a doctor if:       You have blood in your poop 2?3 days after the procedure.     Get help right away if:       You have more than a small amount of blood in your poop.     You see large clumps of tissue (blood clots) in your poop.     Your belly is swollen.     You feel like you may vomit (nauseous).     You vomit.     You have a fever.     You have belly pain that gets worse, and medicine does not help your pain.     These symptoms may be an emergency. Get help right away. Call 911.    Do not wait to see if the symptoms will go away.    Do not drive yourself to the hospital.     Summary       After a colonoscopy, it is common to have a small amount of blood in your poop. You may also have mild cramping and bloating in your belly.     If you were given a sedative during your procedure, do not drive or use machines until your doctor says that it is safe. A sedative is a medicine that helps you relax.     Get help right away if  you have a lot of blood in your poop, feel like you may vomit, have a fever, or have more belly pain.     This information is not intended to replace advice given to you by your health care provider. Make sure you discuss any questions you have with your health care provider.     Document Released: 12/12/2010 Document Revised: 07/02/2021 Document Reviewed: 07/02/2021     Elsevier Patient Education ? [place] After     This sheet gives you information about how to care for yourself after your procedure. Your health care provider may also give you more specific instructions. If you have problems or questions, contact your health care provider.   What can I expect after the procedure?    After the procedure, it is common to have:     Tiredness.     Forgetfulness about what happened after the procedure.     Impaired judgment for important decisions.     Nausea or vomiting.     Some difficulty with balance.     Follow these instructions at home:   For the time period you were told by your health care provider:           Rest as needed.    Do not  participate in activities where you could fall or become injured.    Do not  drive or use machinery.    Do not  drink alcohol.    Do not  take sleeping pills or medicines  that cause drowsiness.    Do not  make important decisions or sign legal documents.    Do not  take care of children on your own.       Eating and drinking     Follow the diet that is recommended by your health care provider.     Drink enough fluid to keep your urine pale yellow.     If you vomit:     Drink water, juice, or soup when you can drink without vomiting.     Make sure you have little or no nausea before eating solid foods.     General instructions     Have a responsible adult stay with you for the time you are told. It is important to have someone help care for you until you are awake and alert.     Take over-the-counter and prescription medicines  only as told by your health care provider.     If you have sleep apnea, surgery and certain medicines can increase your risk for breathing problems. Follow instructions from your health care provider about wearing your sleep device:     Anytime you are sleeping, including during daytime naps.     While taking prescription pain medicines, sleeping medicines, or medicines that make you drowsy.     Avoid smoking.     Keep all follow-up visits as told by your health care provider. This is important.       Contact a health care provider if:       You keep feeling nauseous or you keep vomiting.     You feel light-headed.     You are still sleepy or having trouble with balance after 24 hours.     You develop a rash.     You have a fever.     You have redness or swelling around the IV site.     Get help right away if:       You have trouble breathing.     You have new-onset confusion at home.     Summary       For several hours after your procedure, you may feel tired. You may also be forgetful and have poor judgment.     Have a responsible adult stay with you for the time you are told. It is important to have someone help care for you until you are awake and alert.     Rest as told. Do not  drive or operate machinery. Do not  drink alcohol or take sleeping pills.     Get help right away if you have trouble breathing, or if you suddenly become confused.     This information is not intended to replace advice given to you by your health care provider. Make sure you discuss any questions you have with your health care provider.     Document Released: 03/01/2016 Document Revised: 10/14/2021 Document Reviewed: 10/12/2019     Elsevier Patient Education ? [place].

## 2022-06-23 NOTE — Other (Signed)
Patient Education   Table of Contents     Colon Biopsy     Colonoscopy, Adult, Care After     Endoscopic Ultrasound     To view videos and all your education online visit,   [email or web address]   or scan this QR code with your smartphone.   Access to this content will expire in one year.                    Colon Biopsy     A colon biopsy is a procedure to remove tissue samples from the colon, which is part of the large intestine. The tissue that is removed can then be looked at under a microscope for disease.    The biopsy will be done during a colonoscopy. A colonoscopy involves inserting a colonoscope into the opening between the buttocks (anus) and then into the rectum, colon, and other parts of the large intestine. A colonoscope is a flexible tube that has oil or gel on it (is lubricated) and a camera on the end. The scope allows your health care provider to see the inside of your colon. A biopsy may be done to help screen for or diagnose medical problems. These include:     Tumors.     A type of abnormal growth called polyps.     Inflammation.     Areas of bleeding.     Tell a health care provider about:       Any allergies you have.     All medicines you are taking, including vitamins, herbs, eye drops, creams, and over-the-counter medicines.     Any problems you or family members have had with anesthetic medicines.     Any blood disorders you have.     Any surgeries you have had.     Any medical conditions you have.     Whether you are pregnant or may be pregnant.     What are the risks?    Generally, this is a safe procedure. However, problems may occur, including:     Infection.     Bleeding.     Allergic reactions to medicines.     Damage to nearby structures or organs.     A hole (perforation) in the colon that must be fixed with surgery.     What happens before the procedure?   Eating and drinking restrictions  Follow instructions from your health care provider about eating and drinking,  which may include:     Several days before the procedure - follow a low-fiber diet. Avoid nuts, seeds, dried fruit, raw fruits, and vegetables.     1?3 days before the procedure - follow a clear liquid diet. This diet is limited to liquids or certain soft or frozen foods that you can see through. These liquids or foods include clear broth or bouillon, black coffee or plain tea, clear fruit juice, clear soft drinks or sports drinks, gelatin dessert, and flavored ice. Avoid any liquids, gelatins, or frozen items that contain red or purple dye.     On the day of the procedure - do not eat or drink anything during the 2 hours before the procedure, or within the time period that your health care provider recommends.     Bowel prep  If you were prescribed an oral bowel prep to clean out your colon:     Take it as told by your health care provider. Starting the day before your procedure, you will  need to drink a large amount of medicated liquid. The liquid will cause you to have multiple loose bowel movements until your stool (feces) is almost clear or light green.     If your skin or the anus gets irritated from diarrhea, you may use these to relieve the irritation:     Medicated wipes, such as adult wet wipes with aloe and vitamin E.     A skin-soothing product such as petroleum jelly.     If you vomit while drinking the bowel prep, take a break for up to 60 minutes and then begin the bowel prep again. Call your health care provider if you continue to vomit and cannot take the bowel prep without vomiting.   Medicines  Ask your health care provider about:     Changing or stopping your regular medicines. This is especially important if you are taking diabetes medicines, arthritis medicines, or blood thinners.     Taking medicines such as aspirin and ibuprofen. These medicines can thin your blood. Do not  take these medicines unless your health care provider tells you to take them.     Taking over-the-counter medicines,  vitamins, herbs, and supplements.     When to stop iron therapy given by mouth or IV before the colonoscopy and biopsy (usually 4?5 days before).     General instructions     Plan to have a responsible adult take you home from the hospital or clinic.     Plan to have a responsible adult care for you for the time you are told after you leave the hospital or clinic. This is important.     Ask your health care provider how your biopsy site will be marked.     Ask your health care provider what steps will be taken to prevent infection.     Ask your health care provider for what time period you should avoid tobacco use, including chewing tobacco.     What happens during the procedure?          You will be asked to lie on your side with your knees bent toward your chest.     An IV will be inserted into one of your veins.     You will be given a medicine to help you relax (sedative).     Your health care provider will lubricate the colonoscope.     The colonoscope will be gently eased through the rectum and moved to the area of abnormal tissue.     Air will be delivered into the colon to keep it open. You may feel some pressure or cramping.     The camera on the colonoscope may be used to take images during the procedure.     Surgical instruments will be inserted through the colonoscope to perform the biopsy.     One or more tissue samples will be clipped from your colon. The sample or samples will be sent to the lab for testing.     The procedure may vary among health care providers and hospitals.     What happens after the procedure?       Your blood pressure, heart rate, breathing rate, and blood oxygen level will be monitored until you leave the hospital or clinic.     You may have cramping, bloating, and gas in your abdomen.     There may be some bleeding from the anus.     If you were given a sedative during  the procedure, it can affect you for several hours. Do not  drive or operate machinery until your health care  provider says that it is safe.     It is up to you to get the results of your procedure. Ask your health care provider, or the department that is doing the procedure, when your results will be ready.     Summary       A colon biopsy is a procedure to remove tissue samples from the colon, which is part of the large intestine. The tissue that is removed can then be looked at under a microscope for disease.     Before the procedure, take the oral bowel prep as told by your health care provider to clean out your colon, if the prep was prescribed for you.     Plan to have a responsible adult take you home from the hospital or clinic.     Plan to have a responsible adult care for you for the time you are told after you leave the hospital or clinic. This is important.     This information is not intended to replace advice given to you by your health care provider. Make sure you discuss any questions you have with your health care provider.     Document Released: 04/20/2017 Document Revised: 02/28/2020 Document Reviewed: 02/28/2020     Elsevier Patient Education ? 2023 [employer]         Colonoscopy, Adult, Care After     After a colonoscopy, it is common to have:     A small amount of blood in your poop (stool) for 24 hours.     Some gas.     Mild cramping or bloating in your belly (abdomen).     Follow these instructions at home:   Your doctor may give you more instructions. If you have problems, contact your doctor.   Eating and drinking        Drink enough fluid to keep your pee (urine) pale yellow.     Follow instructions from your doctor about what you cannot eat or drink.     Return to your normal diet as told by your doctor. Avoid heavy or fried foods that are hard to digest.     Activity     Rest as told by your doctor.     Get up to take short walks every 1 to 2 hours. Ask for help if you feel weak or unsteady.     Return to your normal activities when your doctor says that it is safe.     To help cramping and  bloating:        Try walking around.     If told, put heat on your belly. Do this as told by your doctor. Use the heat source that your doctor recommends, such as a moist heat pack or a heating pad.     Place a towel between your skin and the heat source.     Leave the heat on for 20?30 minutes.     Take off the heat if your skin turns bright red. This is very important. If you cannot feel pain, heat, or cold, you have a greater risk of getting burned.       General instructions     If you were given a sedative during your procedure, do not drive or use machines until your doctor says that it is safe. A sedative is a medicine that  helps you relax.     For the first 24 hours after the procedure:    Do not  sign important documents.    Do not  drink alcohol.     Do your daily activities more slowly than normal.     Eat foods that are soft and easy to digest.     Take over-the-counter and prescription medicines only as told by your doctor.     Keep all follow-up visits.       Contact a doctor if:       You have blood in your poop 2?3 days after the procedure.     Get help right away if:       You have more than a small amount of blood in your poop.     You see large clumps of tissue (blood clots) in your poop.     Your belly is swollen.     You feel like you may vomit (nauseous).     You vomit.     You have a fever.     You have belly pain that gets worse, and medicine does not help your pain.     These symptoms may be an emergency. Get help right away. Call 911.    Do not wait to see if the symptoms will go away.    Do not drive yourself to the hospital.     Summary       After a colonoscopy, it is common to have a small amount of blood in your poop. You may also have mild cramping and bloating in your belly.     If you were given a sedative during your procedure, do not drive or use machines until your doctor says that it is safe. A sedative is a medicine that helps you relax.     Get help right away if you have a  lot of blood in your poop, feel like you may vomit, have a fever, or have more belly pain.     This information is not intended to replace advice given to you by your health care provider. Make sure you discuss any questions you have with your health care provider.     Document Released: 12/12/2010 Document Revised: 07/02/2021 Document Reviewed: 07/02/2021     Elsevier Patient Education ? 2023 [employer]         Endoscopic Ultrasound     Endoscopic ultrasound (EUS) is a diagnostic procedure to examine the inside of your gastrointestinal (digestive) tract. EUS is a combination of two types of imaging technology. It uses both a flexible scope to check the digestive tract (endoscopy) and sound waves to get images of surrounding organs and tissues (ultrasonography). EUS gives your health care provider more detailed information than other types of imaging tests.   EUS can be done to check the upper and lower digestive tract. An upper EUS checks the lining of your esophagus, stomach, and small intestine. A lower EUS checks the colon and rectum. EUS can also be used to examine nearby tissues and organs, such as the lungs, gallbladder, liver, pancreas, bladder, and uterus.    You may have an EUS to:     Get more detailed images of a lump or lesion found during a previous exam.     Help your health care provider describe the size and extent (stage) of a cancerous tumor or lesion.     Determine the cause of another condition, like abdominal pain or  abnormal weight loss.     What are the risks?    Generally, this is a safe procedure. However, problems may occur, including:     Infection.     Bleeding.     Allergic reactions to medicines or dyes.     Damage to other structures or organs.     What happens before the procedure?   When to stop eating and drinking     Follow instructions from your health care provider about what you may eat and drink before your procedure. These may include:     8 hours before your procedure      Stop eating most foods. Do not  eat meat, fried foods, or fatty foods.     Eat only light foods, such as toast or crackers.     All liquids are okay except energy drinks and alcohol.         6 hours before your procedure     Stop eating.     Drink only clear liquids, such as water, clear fruit juice, black coffee, plain tea, and sports drinks.    Do not  drink energy drinks or alcohol.     2 hours before your procedure     Stop drinking all liquids.     You may be allowed to take medicines with small sips of water.       If you are having a lower EUS, follow your health care provider's instructions for a bowel prep.   If you do not follow your health care provider's instructions, your procedure may be delayed or canceled.   Medicines  Ask your health care provider about:     Changing or stopping your regular medicines. This is especially important if you are taking diabetes medicines or blood thinners.     Taking medicines such as aspirin and ibuprofen. These medicines can thin your blood. Do not  take these medicines unless your health care provider tells you to take them.     Taking over-the-counter medicines, vitamins, herbs, and supplements.     General instructions     Plan to have a responsible adult take you home from the hospital or clinic.     Plan to have a responsible adult care for you for the time you are told after you leave the hospital or clinic. This is important.     What happens during the procedure?               An IV will be inserted into one of your veins.     You may be given one or more of the following:     A medicine to help you relax (sedative).     A medicine to make you fall asleep (general anesthetic).     Your breathing and vital signs will be monitored throughout your procedure.     If you are having an upper EUS, a flexible scope with a camera attached (endoscope) will be inserted into your mouth. It will be advanced into your esophagus (the part of the body that moves food from  your mouth to your stomach), into your stomach, and into your small intestine.     If you are having a lower EUS, the endoscope will be inserted through your anus into your colon.     A small ultrasound device on the tip of the endoscope can take images of the lining of your digestive tract and nearby organs.  Your provider can then see the endoscopy images and the ultrasound images on monitors at the same time. The ultrasound images can also be used to guide other procedures, such as collecting a tissue sample for testing (biopsy or fine needle aspiration).     The procedure may vary among health care providers and hospitals.     What can I expect after the procedure?       After your procedure, it is common to have:     A sore throat.     Bloating.     Fatigue from sedation.     If you were given a sedative during the procedure, it can affect you for several hours. Do not  drive or operate machinery until your health care provider says that it is safe.     It is up to you to get the results of your procedure. Ask your health care provider, or the department that is doing the procedure, when your results will be ready.     Follow these instructions at home:       Take over-the-counter and prescription medicines only as told by your health care provider.     Return to your normal activities as told by your health care provider. Ask your health care provider what activities are safe for you.     Keep all follow-up visits. This is important.     Contact a health care provider if:       You cannot eat or drink without vomiting.     Your sore throat gets worse or lasts longer than a few days.     You have a fever.     Get help right away if:       You have blood in your vomit or stool (feces).     You have black stools.     You have chest pain or shortness of breath.     You have severe pain in your abdomen.     You feel faint or dizzy.     Summary       Endoscopic ultrasound (EUS) is a diagnostic procedure to  examine the inside of your gastrointestinal (digestive) tract.     EUS is a combination of two types of imaging technology and gives your health care provider more detailed information than other imaging tests.     EUS can be done to check the upper and lower digestive tract.     For an upper EUS, a flexible scope with a camera attached (endoscope) will be inserted into your mouth. For a lower EUS, the endoscope will be inserted into your anus.     EUS can also be used to examine nearby tissues and organs, such as the lungs, gallbladder, liver, pancreas, bladder, and uterus.     This information is not intended to replace advice given to you by your health care provider. Make sure you discuss any questions you have with your health care provider.     Document Released: 04/23/2021 Document Revised: 07/23/2021 Document Reviewed: 04/23/2021     Elsevier Patient Education ? 2023 [employer]

## 2022-06-23 NOTE — H&P (Signed)
GI Endoscopy Short H&P:    41 yo woman presents for colonoscopy for IBD surveillance. Previous history and physical reviewed.  Otherwise, no changes necessary.  We reviewed the risks of the procedure which include bleeding, infection, perforation, pancreatitis, and death.  Patient understands, has signed consent, and is willing to proceed.    Medications  Medications      Start Medication Dose/Rate, Route, Frequency Ordered Stop    06/23/22 1045 lactated Ringer's infusion         50 mL/hr, IV, Continuous 06/23/22 1011 --                 Past Medical History   has a past medical history of Acute Crohn's disease (CMS/HCC), Bradycardia, Graves disease, IBS (irritable bowel syndrome), Irritable bowel syndrome, and Migraines.    Allergies  Imitrex [sumatriptan], Metoprolol succinate, Penicillins, and Amoxicillin    Surgical History   has a past surgical history that includes Mouth surgery.     Family History  No family history on file.    Social History   reports that she has quit smoking. Her smoking use included cigarettes. She has never used smokeless tobacco. She reports that she does not currently use alcohol. She reports current drug use. Drug: Marijuana.    Vitals:  Temp: 35.8 C (96.4 F)  Pulse: 50  BP: 124/75  Resp: 14  SpO2: 98 %    Temp:  [35.8 C (96.4 F)] 35.8 C (96.4 F)  Pulse:  [50] 50  Resp:  [14] 14  BP: (124)/(75) 124/75     Physical Exam:  General: Alert and oriented, in no acute distress    Lab Results:  Hospital Outpatient Visit on 06/23/2022   Component Date Value Ref Range Status   . Preg Test, Ur 06/23/2022 Negative   Final   Admission on 06/15/2022, Discharged on 06/17/2022   Component Date Value Ref Range Status   . Sodium 06/15/2022 138  135 - 146 mmol/L Final   . Potassium 06/15/2022 4.0  3.6 - 5.2 mmol/L Final    Hemolyzed, may increase result   . Chloride 06/15/2022 108  98 - 110 mmol/L Final   . CO2 (Bicarbonate) 06/15/2022 23  20 - 32 mmol/L Final   . Anion Gap 06/15/2022 7  3 - 14  mmol/L Final   . BUN 06/15/2022 17  6 - 24 mg/dL Final   . Creatinine 10/27/253607/24/2023 0.96  0.55 - 1.30 mg/dL Final   . eGFRcr 64/40/347407/24/2023 77  >=60 mL/min/1.3667m*2 Final   . Glucose 06/15/2022 147 (H)  70 - 110 mg/dL Final   . Fasting? 25/95/638707/24/2023 Unknown   Final   . Calcium 06/15/2022 8.7  8.5 - 10.5 mg/dL Final   . AST 56/43/329507/24/2023 23  6 - 42 U/L Final    Hemolyzed, may increase result   . ALT 06/15/2022 22  0 - 55 U/L Final   . Alkaline phosphatase 06/15/2022 80  30 - 130 U/L Final   . Protein, total 06/15/2022 7.3  6.0 - 8.4 g/dL Final   . Albumin 18/84/166007/24/2023 3.6  3.2 - 5.0 g/dL Final   . Bilirubin, total 06/15/2022 0.2  0.2 - 1.2 mg/dL Final   . WBC 63/01/601007/24/2023 14.9 (H)  4.0 - 11.0 K/uL Final   . RBC 06/15/2022 5.05  3.70 - 5.20 M/uL Final   . Hemoglobin 06/15/2022 13.7  11.0 - 16.0 g/dL Final   . Hematocrit 93/23/557307/24/2023 42.5  32.0 - 47.0 % Final   .  MCV 06/15/2022 84.2  80.0 - 100.0 fL Final   . MCH 06/15/2022 27.1  26.0 - 34.0 pg Final   . MCHC 06/15/2022 32.2  31.0 - 37.0 g/dL Final   . RDW-CV 16/08/9603 13.2  11.5 - 14.5 % Final   . RDW-SD 06/15/2022 40.5  35.0 - 51.0 fL Final   . Platelets 06/15/2022 305  150 - 400 K/uL Final   . MPV 06/15/2022 9.1  9.1 - 12.4 fL Final   . NRBC % 06/15/2022 0.0  0.0 - 0.0 % Final   . NRBC Absolute 06/15/2022 0.00  0.00 - 2.00 K/uL Final   . Extra Tube 06/15/2022 Hold for add-ons.   Final    Auto resulted.   Marland Kitchen Extra Tube 06/15/2022 Hold for add-ons.   Final    Auto resulted.   . Color, Ur 06/16/2022 Yellow  Yellow, Dark Yellow Final   . Clarity, Ur 06/16/2022 Clear  Clear Final   . Specific Gravity, Ur 06/16/2022 1.015  1.005 - 1.030 Final   . pH, Ur 06/16/2022 6.5  5.0 - 8.0 Final   . Protein,Ur 06/16/2022 Negative  Negative mg/dL Final   . Herminio Commons 54/07/8118 Negative  Negative mg/dL Final   . Ketones, Ur 14/78/2956 Negative  Negative mg/dL Final   . Bilirubin, Ur 06/16/2022 Negative  Negative Final   . Blood, Ur 06/16/2022 Negative  Negative Final   . Urobilinogen, Ur 06/16/2022 0.2   0.2-1.0 E.U./dl E.U./dl Final   . Nitrite, Ur 21/30/8657 Negative  Negative Final   . Leukocyte Esterase, Ur 06/16/2022 Trace (A)  Negative WBC/uL Final   . RBC, Ur 06/16/2022 1  0-4 cells/HPF cells/HPF Final   . WBC, Ur 06/16/2022 1  0-5 cells/HPF cells/HPF Final   . Epithelial Cells, UR 06/16/2022 1  0-5 cells/HPF cells/HPF Final   . Bacteria, Ur 06/16/2022 None Seen  None Seen Final   . Casts, Ur 06/16/2022 0  0-4 cells/LPF cells/LPF Final   . Neutrophils % 06/15/2022 59  % Final   . Lymphocytes % 06/15/2022 36  % Final   . Monocytes % 06/15/2022 5  % Final   . Neutrophils Absolute 06/15/2022 8.79 (H)  1.50 - 7.95 K/uL Final   . Lymphocytes Absolute 06/15/2022 5.36 (H)  0.70 - 4.00 K/uL Final   . Monocytes Absolute 06/15/2022 0.75  0.36 - 0.77 K/uL Final   . RBC Morphology 06/15/2022 Normal  Normal, Not Performed Final   . PLT Morphology 06/15/2022 Normal  Normal, Not Performed Final   . Total WBC Counted 06/15/2022 100   Final   . Lipase 06/15/2022 35  13 - 75 U/L Final   . hCG Qualitative Serum 06/15/2022 Negative   Final   . CRP 06/15/2022 <0.29  0.29 - 0.80 mg/dL Final   . Sed Rate 84/69/6295 20  0 - 20 mm/hr Final   . Lyme Ab Screen 06/16/2022 <0.90  index Final                       Index                Interpretation                     -----                --------------                     < 0.90  Negative                     0.90-1.09            Equivocal                     > 1.09               Positive      As recommended by the Food and Drug Administration   (FDA), all samples with positive or equivocal   results in a Borrelia burgdorferi antibody screen  will be tested using a blot method. Positive or   equivocal screening test results should not be   interpreted as truly positive until verified as such   using a supplemental assay (e.g., B. burgdorferi blot).     The screening test and/or blot for B. burgdorferi   antibodies may be falsely negative in early stages  of Lyme disease,  including the period when erythema   migrans is apparent.      . Protime 06/15/2022 10.9  9.3 - 11.6 seconds Final   . INR 06/15/2022 1.00  See Interpretive Comment Final    RECOMMENDED INR WITH WARFARIN/COUMADIN THERAPY:                                     INR: 2.0-3.0   Deep vein thrombosis           Atrial Fibrillation   Tissue Prosthetic Valve   Stroke Prevention    INR:2.5-3.5   Mechanical Prosthetic Valve         and Recurrent Systemic Embolism     . aPTT 06/15/2022 30  23 - 32 Seconds Final   . Extra Tube 06/16/2022 Hold for add-ons.   Final    Auto resulted.   Marland Kitchen Extra Tube 06/16/2022 Hold for add-ons.   Final    Auto resulted.   . Sodium 06/17/2022 144  135 - 146 mmol/L Final   . Potassium 06/17/2022 3.6  3.6 - 5.2 mmol/L Final   . Chloride 06/17/2022 112 (H)  98 - 110 mmol/L Final   . CO2 (Bicarbonate) 06/17/2022 23  20 - 32 mmol/L Final   . Anion Gap 06/17/2022 9  3 - 14 mmol/L Final   . BUN 06/17/2022 6  6 - 24 mg/dL Final   . Creatinine 16/08/9603 0.63  0.55 - 1.30 mg/dL Final   . eGFRcr 54/07/8118 116  >=60 mL/min/1.63m*2 Final   . Glucose 06/17/2022 85  70 - 110 mg/dL Final   . Fasting? 14/78/2956 Unknown   Final   . Calcium 06/17/2022 8.4 (L)  8.5 - 10.5 mg/dL Final   . AST 21/30/8657 10  6 - 42 U/L Final   . ALT 06/17/2022 19  0 - 55 U/L Final   . Alkaline phosphatase 06/17/2022 58  30 - 130 U/L Final   . Protein, total 06/17/2022 6.2  6.0 - 8.4 g/dL Final   . Albumin 84/69/6295 3.0 (L)  3.2 - 5.0 g/dL Final   . Bilirubin, total 06/17/2022 0.5  0.2 - 1.2 mg/dL Final   . WBC 28/41/3244 10.2  4.0 - 11.0 K/uL Final   . RBC 06/17/2022 4.83  3.70 - 5.20 M/uL Final   . Hemoglobin 06/17/2022 13.0  11.0 - 16.0 g/dL Final   . Hematocrit 11/25/7251 40.5  32.0 - 47.0 % Final   .  MCV 06/17/2022 83.9  80.0 - 100.0 fL Final   . MCH 06/17/2022 26.9  26.0 - 34.0 pg Final   . MCHC 06/17/2022 32.1  31.0 - 37.0 g/dL Final   . RDW-SD 29/56/2130 40.7  35.0 - 51.0 fL Final   . RDW-CV 06/17/2022 13.2  11.5 - 14.5 % Final    . Platelets 06/17/2022 267  150 - 400 K/uL Final   . MPV 06/17/2022 9.0 (L)  9.1 - 12.4 fL Final   . NRBC % 06/17/2022 0.0  0.0 - 0.0 % Final   . NRBC Absolute 06/17/2022 0.00  0.00 - 2.00 K/uL Final   . TSH 06/15/2022 0.459  0.358 - 3.740 uIU/mL Final   . T3, Free 06/17/2022 3.2  2.3 - 4.2 pg/mL Final

## 2022-06-23 NOTE — Nursing Note (Addendum)
Colon: diverticulosis  Terminal ileum bx - hx IBD, UC, right colon bx - h/o IBD, UC, left colon bx - h/o IBD, UC, rectal colon bx - h/o IBD, UC

## 2022-06-23 NOTE — Discharge Instructions (Signed)
RESUME ALL HOME MEDICATIONS TODAY AT USUAL DOSE AND SCHEDULE.

## 2022-06-23 NOTE — Anesthesia Post-Procedure Evaluation (Signed)
Patient: Tiffany Leach    Procedure Summary     Date: 06/23/22 Room / Location: [facility]    Anesthesia Start: 1043 Anesthesia Stop: 1104    Procedure: COLONOSCOPY Diagnosis:       Projectile vomiting      (Determine extent and severity of inflammatory bowel disease)      (Obtain more precise diagnosis of inflammatory bowel disease)    Scheduled Providers: Lubertha BasqueVijay Jayvion Stefanski, MD; Marcelino DusterMichelle Drown, CRNA; Craig GuessLaura Dos Santos, RN; Roberto ScalesSupriya Rao, MD Responsible Provider: Lubertha BasqueVijay Pamella Samons, MD    Anesthesia Type: MAC ASA Status: 2          Anesthesia Type: MAC    Vitals Value Taken Time   BP 137/98 06/23/22 1128   Temp  06/23/22 1157   Pulse 58 06/23/22 1128   Resp 18 06/23/22 1128   SpO2 98 % 06/23/22 1128       Anesthesia Post Evaluation Note:    Patient location during evaluation: PACU  Patient participation: able to participate  Level of consciousness: arousable  Cardiovascular and Hydration status: stable  Respiratory Status Stable and Airway Patent: yes  Nausea and Vomiting Control Satisfactory: yes  Pain management: adequate     Vitals reviewed: yes  Unplanned ICU Admission: no      No notable events documented.

## 2022-06-23 NOTE — Anesthesia Pre-Procedure Evaluation (Signed)
Patient: Tiffany Leach    Procedure Information     Date/Time: 06/23/22 1000    Scheduled providers: Lubertha BasqueVijay Logyn Dedominicis, MD; Marcelino DusterMichelle Drown, CRNA; Craig GuessLaura Dos Santos, RN; Roberto ScalesSupriya Rao, MD    Procedure: COLONOSCOPY    Location: [facility]          Relevant Problems   Pulmonary   (+) Current smoker      GI   (+) Crohn's disease (CMS/HCC)   (+) Ulcerative colitis (CMS/HCC)       Clinical information reviewed:    Allergies    Med Hx  Surg Hx   Fam Hx           Physical Exam    Airway  Mallampati: I  TM distance: >3 FB  Mouth opening: >3 FB  Neck ROM: full     Cardiovascular   Rhythm: regular  Rate: normal  Functional capacity: greater than or equal to 4 METS without symptoms   Dental - normal exam     Pulmonary - normal exam     Abdominal - normal exam     General   Alert                 Anesthesia Plan    ASA 2   NPO status verified    MAC     Monitoring: standard monitors  Postoperative Pain Control: IV/PO analgesics    Multimodal PONV prophylaxis planned  Essential imaging and labs available and reviewed    Anesthetic plan and risks discussed with patient.  Use of blood products discussed with patient

## 2022-06-23 NOTE — Other (Signed)
Patient Education   Table of Contents     Colon Biopsy     Colonoscopy, Adult, Care After     Monitored Anesthesia Care, Care After     To view videos and all your education online visit,   [email or web address]   or scan this QR code with your smartphone.   Access to this content will expire in one year.                    Colon Biopsy     A colon biopsy is a procedure to remove tissue samples from the colon, which is part of the large intestine. The tissue that is removed can then be looked at under a microscope for disease.    The biopsy will be done during a colonoscopy. A colonoscopy involves inserting a colonoscope into the opening between the buttocks (anus) and then into the rectum, colon, and other parts of the large intestine. A colonoscope is a flexible tube that has oil or gel on it (is lubricated) and a camera on the end. The scope allows your health care provider to see the inside of your colon. A biopsy may be done to help screen for or diagnose medical problems. These include:     Tumors.     A type of abnormal growth called polyps.     Inflammation.     Areas of bleeding.     Tell a health care provider about:       Any allergies you have.     All medicines you are taking, including vitamins, herbs, eye drops, creams, and over-the-counter medicines.     Any problems you or family members have had with anesthetic medicines.     Any blood disorders you have.     Any surgeries you have had.     Any medical conditions you have.     Whether you are pregnant or may be pregnant.     What are the risks?    Generally, this is a safe procedure. However, problems may occur, including:     Infection.     Bleeding.     Allergic reactions to medicines.     Damage to nearby structures or organs.     A hole (perforation) in the colon that must be fixed with surgery.     What happens before the procedure?   Eating and drinking restrictions  Follow instructions from your health care provider about eating  and drinking, which may include:     Several days before the procedure - follow a low-fiber diet. Avoid nuts, seeds, dried fruit, raw fruits, and vegetables.     1?3 days before the procedure - follow a clear liquid diet. This diet is limited to liquids or certain soft or frozen foods that you can see through. These liquids or foods include clear broth or bouillon, black coffee or plain tea, clear fruit juice, clear soft drinks or sports drinks, gelatin dessert, and flavored ice. Avoid any liquids, gelatins, or frozen items that contain red or purple dye.     On the day of the procedure - do not eat or drink anything during the 2 hours before the procedure, or within the time period that your health care provider recommends.     Bowel prep  If you were prescribed an oral bowel prep to clean out your colon:     Take it as told by your health care provider. Starting the day before your  procedure, you will need to drink a large amount of medicated liquid. The liquid will cause you to have multiple loose bowel movements until your stool (feces) is almost clear or light green.     If your skin or the anus gets irritated from diarrhea, you may use these to relieve the irritation:     Medicated wipes, such as adult wet wipes with aloe and vitamin E.     A skin-soothing product such as petroleum jelly.     If you vomit while drinking the bowel prep, take a break for up to 60 minutes and then begin the bowel prep again. Call your health care provider if you continue to vomit and cannot take the bowel prep without vomiting.   Medicines  Ask your health care provider about:     Changing or stopping your regular medicines. This is especially important if you are taking diabetes medicines, arthritis medicines, or blood thinners.     Taking medicines such as aspirin and ibuprofen. These medicines can thin your blood. Do not  take these medicines unless your health care provider tells you to take them.     Taking over-the-counter  medicines, vitamins, herbs, and supplements.     When to stop iron therapy given by mouth or IV before the colonoscopy and biopsy (usually 4?5 days before).     General instructions     Plan to have a responsible adult take you home from the hospital or clinic.     Plan to have a responsible adult care for you for the time you are told after you leave the hospital or clinic. This is important.     Ask your health care provider how your biopsy site will be marked.     Ask your health care provider what steps will be taken to prevent infection.     Ask your health care provider for what time period you should avoid tobacco use, including chewing tobacco.     What happens during the procedure?          You will be asked to lie on your side with your knees bent toward your chest.     An IV will be inserted into one of your veins.     You will be given a medicine to help you relax (sedative).     Your health care provider will lubricate the colonoscope.     The colonoscope will be gently eased through the rectum and moved to the area of abnormal tissue.     Air will be delivered into the colon to keep it open. You may feel some pressure or cramping.     The camera on the colonoscope may be used to take images during the procedure.     Surgical instruments will be inserted through the colonoscope to perform the biopsy.     One or more tissue samples will be clipped from your colon. The sample or samples will be sent to the lab for testing.     The procedure may vary among health care providers and hospitals.     What happens after the procedure?       Your blood pressure, heart rate, breathing rate, and blood oxygen level will be monitored until you leave the hospital or clinic.     You may have cramping, bloating, and gas in your abdomen.     There may be some bleeding from the anus.     If you were given  a sedative during the procedure, it can affect you for several hours. Do not  drive or operate machinery until your  health care provider says that it is safe.     It is up to you to get the results of your procedure. Ask your health care provider, or the department that is doing the procedure, when your results will be ready.     Summary       A colon biopsy is a procedure to remove tissue samples from the colon, which is part of the large intestine. The tissue that is removed can then be looked at under a microscope for disease.     Before the procedure, take the oral bowel prep as told by your health care provider to clean out your colon, if the prep was prescribed for you.     Plan to have a responsible adult take you home from the hospital or clinic.     Plan to have a responsible adult care for you for the time you are told after you leave the hospital or clinic. This is important.     This information is not intended to replace advice given to you by your health care provider. Make sure you discuss any questions you have with your health care provider.     Document Released: 04/20/2017 Document Revised: 02/28/2020 Document Reviewed: 02/28/2020     Elsevier Patient Education ? [place].         Colonoscopy, Adult, Care After     After a colonoscopy, it is common to have:     A small amount of blood in your poop (stool) for 24 hours.     Some gas.     Mild cramping or bloating in your belly (abdomen).     Follow these instructions at home:   Your doctor may give you more instructions. If you have problems, contact your doctor.   Eating and drinking        Drink enough fluid to keep your pee (urine) pale yellow.     Follow instructions from your doctor about what you cannot eat or drink.     Return to your normal diet as told by your doctor. Avoid heavy or fried foods that are hard to digest.     Activity     Rest as told by your doctor.     Get up to take short walks every 1 to 2 hours. Ask for help if you feel weak or unsteady.     Return to your normal activities when your doctor says that it is safe.     To help  cramping and bloating:        Try walking around.     If told, put heat on your belly. Do this as told by your doctor. Use the heat source that your doctor recommends, such as a moist heat pack or a heating pad.     Place a towel between your skin and the heat source.     Leave the heat on for 20?30 minutes.     Take off the heat if your skin turns bright red. This is very important. If you cannot feel pain, heat, or cold, you have a greater risk of getting burned.       General instructions     If you were given a sedative during your procedure, do not drive or use machines until your doctor says that it is safe. A sedative is  a medicine that helps you relax.     For the first 24 hours after the procedure:    Do not  sign important documents.    Do not  drink alcohol.     Do your daily activities more slowly than normal.     Eat foods that are soft and easy to digest.     Take over-the-counter and prescription medicines only as told by your doctor.     Keep all follow-up visits.       Contact a doctor if:       You have blood in your poop 2?3 days after the procedure.     Get help right away if:       You have more than a small amount of blood in your poop.     You see large clumps of tissue (blood clots) in your poop.     Your belly is swollen.     You feel like you may vomit (nauseous).     You vomit.     You have a fever.     You have belly pain that gets worse, and medicine does not help your pain.     These symptoms may be an emergency. Get help right away. Call 911.    Do not wait to see if the symptoms will go away.    Do not drive yourself to the hospital.     Summary       After a colonoscopy, it is common to have a small amount of blood in your poop. You may also have mild cramping and bloating in your belly.     If you were given a sedative during your procedure, do not drive or use machines until your doctor says that it is safe. A sedative is a medicine that helps you relax.     Get help right away if  you have a lot of blood in your poop, feel like you may vomit, have a fever, or have more belly pain.     This information is not intended to replace advice given to you by your health care provider. Make sure you discuss any questions you have with your health care provider.     Document Released: 12/12/2010 Document Revised: 07/02/2021 Document Reviewed: 07/02/2021     Elsevier Patient Education ? [place] After     This sheet gives you information about how to care for yourself after your procedure. Your health care provider may also give you more specific instructions. If you have problems or questions, contact your health care provider.   What can I expect after the procedure?    After the procedure, it is common to have:     Tiredness.     Forgetfulness about what happened after the procedure.     Impaired judgment for important decisions.     Nausea or vomiting.     Some difficulty with balance.     Follow these instructions at home:   For the time period you were told by your health care provider:           Rest as needed.    Do not  participate in activities where you could fall or become injured.    Do not  drive or use machinery.    Do not  drink alcohol.    Do not  take sleeping pills or medicines  that cause drowsiness.    Do not  make important decisions or sign legal documents.    Do not  take care of children on your own.       Eating and drinking     Follow the diet that is recommended by your health care provider.     Drink enough fluid to keep your urine pale yellow.     If you vomit:     Drink water, juice, or soup when you can drink without vomiting.     Make sure you have little or no nausea before eating solid foods.     General instructions     Have a responsible adult stay with you for the time you are told. It is important to have someone help care for you until you are awake and alert.     Take over-the-counter and prescription medicines  only as told by your health care provider.     If you have sleep apnea, surgery and certain medicines can increase your risk for breathing problems. Follow instructions from your health care provider about wearing your sleep device:     Anytime you are sleeping, including during daytime naps.     While taking prescription pain medicines, sleeping medicines, or medicines that make you drowsy.     Avoid smoking.     Keep all follow-up visits as told by your health care provider. This is important.       Contact a health care provider if:       You keep feeling nauseous or you keep vomiting.     You feel light-headed.     You are still sleepy or having trouble with balance after 24 hours.     You develop a rash.     You have a fever.     You have redness or swelling around the IV site.     Get help right away if:       You have trouble breathing.     You have new-onset confusion at home.     Summary       For several hours after your procedure, you may feel tired. You may also be forgetful and have poor judgment.     Have a responsible adult stay with you for the time you are told. It is important to have someone help care for you until you are awake and alert.     Rest as told. Do not  drive or operate machinery. Do not  drink alcohol or take sleeping pills.     Get help right away if you have trouble breathing, or if you suddenly become confused.     This information is not intended to replace advice given to you by your health care provider. Make sure you discuss any questions you have with your health care provider.     Document Released: 03/01/2016 Document Revised: 10/14/2021 Document Reviewed: 10/12/2019     Elsevier Patient Education ? [place].

## 2022-06-24 LAB — TISSUE PATHOLOGY

## 2022-06-29 ENCOUNTER — Inpatient Hospital Stay: Admit: 2022-06-29 | Payer: MEDICAID | Primary: Internal Medicine

## 2022-06-29 DIAGNOSIS — R112 Nausea with vomiting, unspecified: Secondary | ICD-10-CM

## 2022-06-29 MED ORDER — technetium Tc-99m sulfur colloid (Nycomed-SC) radio-isotope solution 0.5 millicurie
Freq: Once | INTRAVENOUS | Status: AC
Start: 2022-06-29 — End: 2022-06-29
  Administered 2022-06-29: 12:00:00 0.5 via ORAL

## 2022-06-29 MED FILL — TECHNETIUM TC 99M SULFUR COLLOID: 0.5000 0.5000 millicurie | INTRAVENOUS | Qty: 0.5

## 2022-07-01 LAB — BMP (EXT)
Anion Gap (EXT): 16 mmol/L (ref 3–17)
BUN (EXT): 10 mg/dL (ref 8–25)
CO2 (EXT): 19 mmol/L — ABNORMAL LOW (ref 23–32)
CalciumCalcium (EXT): 9.6 mg/dL (ref 8.5–10.5)
Chloride (EXT): 104 mmol/L (ref 98–108)
Creatinine (EXT): 0.73 mg/dL (ref 0.60–1.50)
GFR Estimated (Calc) (EXT): 107 mL/min/{1.73_m2} (ref >59)
Glucose (EXT): 101 mg/dL (ref 70–110)
Potassium (EXT): 3.6 mmol/L (ref 3.4–5.0)
Sodium (EXT): 139 mmol/L (ref 135–145)

## 2022-07-10 ENCOUNTER — Inpatient Hospital Stay: Admit: 2022-07-10 | Discharge: 2022-07-11 | Discharge status: Against Medical Advice | Payer: MEDICAID

## 2022-07-10 DIAGNOSIS — R079 Chest pain, unspecified: Secondary | ICD-10-CM

## 2022-07-11 LAB — BMP (EXT)
Anion Gap (EXT): 10 mmol/L (ref 3–17)
BUN (EXT): 16 mg/dL (ref 8–25)
CO2 (EXT): 26 mmol/L (ref 23–32)
CalciumCalcium (EXT): 8.7 mg/dL (ref 8.5–10.5)
Chloride (EXT): 103 mmol/L (ref 98–108)
Creatinine (EXT): 0.75 mg/dL (ref 0.60–1.50)
GFR Estimated (Calc) (EXT): 104 mL/min/{1.73_m2} (ref >59)
Glucose (EXT): 114 mg/dL — ABNORMAL HIGH (ref 70–110)
Potassium (EXT): 3.8 mmol/L (ref 3.4–5.0)
Sodium (EXT): 139 mmol/L (ref 135–145)

## 2022-07-24 ENCOUNTER — Encounter: Payer: MEDICAID | Primary: Internal Medicine

## 2022-07-31 ENCOUNTER — Emergency Department: Admit: 2022-07-31 | Payer: MEDICAID | Primary: Internal Medicine

## 2022-07-31 ENCOUNTER — Inpatient Hospital Stay
Admit: 2022-07-31 | Discharge: 2022-07-31 | Disposition: A | Discharge status: Home / Self Care | Payer: MEDICAID | Attending: Student in an Organized Health Care Education/Training Program

## 2022-07-31 DIAGNOSIS — R1013 Epigastric pain: Secondary | ICD-10-CM

## 2022-07-31 LAB — COMPREHENSIVE METABOLIC PANEL
ALT: 19 U/L (ref 0–55)
AST: 10 U/L (ref 6–42)
Albumin: 3.4 g/dL (ref 3.2–5.0)
Alkaline phosphatase: 71 U/L (ref 30–130)
Anion Gap: 5 mmol/L (ref 3–14)
BUN: 10 mg/dL (ref 6–24)
Bilirubin, total: 0.2 mg/dL (ref 0.2–1.2)
CO2 (Bicarbonate): 26 mmol/L (ref 20–32)
Calcium: 8.8 mg/dL (ref 8.5–10.5)
Chloride: 109 mmol/L (ref 98–110)
Creatinine: 0.79 mg/dL (ref 0.55–1.30)
Glucose: 103 mg/dL (ref 70–110)
Potassium: 3.9 mmol/L (ref 3.6–5.2)
Protein, total: 7.4 g/dL (ref 6.0–8.4)
Sodium: 140 mmol/L (ref 135–146)
eGFRcr: 98 mL/min/{1.73_m2} (ref >=60)

## 2022-07-31 LAB — CBC WITH DIFFERENTIAL
Basophils %: 0.7 %
Basophils Absolute: 0.07 10*3/uL (ref 0.00–0.22)
Eosinophils %: 1 %
Eosinophils Absolute: 0.1 10*3/uL (ref 0.00–0.50)
Hematocrit: 42.2 % (ref 32.0–47.0)
Hemoglobin: 13.6 g/dL (ref 11.0–16.0)
Immature Granulocytes %: 0.4 %
Immature Granulocytes Absolute: 0.04 10*3/uL (ref 0.00–0.10)
Lymphocyte %: 41.7 %
Lymphocytes Absolute: 4.02 10*3/uL — ABNORMAL HIGH (ref 0.70–4.00)
MCH: 27.1 pg (ref 26.0–34.0)
MCHC: 32.2 g/dL (ref 31.0–37.0)
MCV: 84.1 fL (ref 80.0–100.0)
MPV: 8.7 fL — ABNORMAL LOW (ref 9.1–12.4)
Monocytes %: 6.1 %
Monocytes Absolute: 0.59 10*3/uL (ref 0.36–0.77)
NRBC %: 0 % (ref 0.0–0.0)
NRBC Absolute: 0 10*3/uL (ref 0.00–2.00)
Neutrophil %: 50.1 %
Neutrophils Absolute: 4.83 10*3/uL (ref 1.50–7.95)
Platelets: 332 10*3/uL (ref 150–400)
RBC: 5.02 M/uL (ref 3.70–5.20)
RDW-CV: 13.2 % (ref 11.5–14.5)
RDW-SD: 40 fL (ref 35.0–51.0)
WBC: 9.7 10*3/uL (ref 4.0–11.0)

## 2022-07-31 LAB — URINALYSIS REFLEX TO CULTURE
Blood, Ur: NEGATIVE
Casts, Ur: 0 /LPF
Epithelial Cells, UR: 4 {cells}/[HPF]
Glucose,Ur: NEGATIVE mg/dL
Nitrite, Ur: NEGATIVE
Protein,Ur: 30 mg/dL — AB
RBC, Ur: 1 {cells}/[HPF] (ref 0–2)
Specific Gravity, Ur: 1.025 (ref 1.005–1.030)
Urobilinogen, Ur: 0.2 U/dL (ref 0.2–1.0)
WBC, Ur: 3 {cells}/[HPF] (ref 0–5)
pH, Ur: 5.5 (ref 5.0–8.0)

## 2022-07-31 LAB — LIPASE: Lipase: 29 U/L (ref 13–75)

## 2022-07-31 LAB — TROPONIN I, HIGH SENSITIVITY: Troponin I, High Sensitivity: <6 ng/L

## 2022-07-31 LAB — HCG, SERUM, QUALITATIVE: hCG Qualitative Serum: NEGATIVE

## 2022-07-31 LAB — MAGNESIUM: Magnesium: 1.9 mg/dL (ref 1.6–2.6)

## 2022-07-31 MED ORDER — ketorolac (Toradol) injection 15 mg
30 | Freq: Once | INTRAMUSCULAR | Status: AC
Start: 2022-07-31 — End: 2022-07-31
  Administered 2022-07-31: 11:00:00 15 mg via INTRAVENOUS

## 2022-07-31 MED ORDER — sodium chloride 0.9 % bolus 1,000 mL
0.9 | Freq: Once | INTRAVENOUS | Status: AC
Start: 2022-07-31 — End: 2022-07-31
  Administered 2022-07-31: 11:00:00 1000 mL via INTRAVENOUS

## 2022-07-31 MED ORDER — famotidine (Pepcid) injection 20 mg
20 | Freq: Once | INTRAVENOUS | Status: AC
Start: 2022-07-31 — End: 2022-07-31
  Administered 2022-07-31: 11:00:00 20 mg via INTRAVENOUS

## 2022-07-31 MED ORDER — droperidol (Inapsine) injection 2.5 mg
2.5 | Freq: Once | INTRAMUSCULAR | Status: AC
Start: 2022-07-31 — End: 2022-07-31
  Administered 2022-07-31: 11:00:00 2.5 mg via INTRAVENOUS

## 2022-07-31 MED ORDER — iohexol (OMNIPaque) 350 mg iodine/mL solution 85 mL
350 | Freq: Once | INTRAVENOUS | Status: AC
Start: 2022-07-31 — End: 2022-07-31
  Administered 2022-07-31: 10:00:00 85 mL via INTRAVENOUS

## 2022-07-31 MED FILL — DROPERIDOL 2.5 MG/ML INJECTION SOLUTION: 2.5 2.5 mg/mL | INTRAMUSCULAR | Qty: 2

## 2022-07-31 MED FILL — FAMOTIDINE (PF) 20 MG/2 ML INTRAVENOUS SOLUTION: 20 20 mg/2 mL | INTRAVENOUS | Qty: 2

## 2022-07-31 MED FILL — KETOROLAC 30 MG/ML (1 ML) INJECTION SOLUTION: 30 30 mg/mL (1 mL) | INTRAMUSCULAR | Qty: 1

## 2022-07-31 NOTE — ED Provider Notes (Signed)
HPI   Chief Complaint   Patient presents with   . Abdominal Pain       HPI  [name] is a pleasant 41yo F senting today for abdominal pain, upper abdominal pain, nausea, worse after eating, moves up to chest and shoulders.  Has been going on for the last several months, has been worse over the last 24 hours.  Reports sometimes with dark stool, no flank pain, no urine symptoms.  She has had CT imaging and work-up in the past for similar issues.  She has smoked marijuana regularly in the past, did stop the last month, smoked some marijuana yesterday.  No significant fevers, chills, new cough, sore throat or runny nose.  She does follow with her GI physician who is changing her medications, today or yesterday was the last dose of omeprazole and she needs to pick up the new medications as recommended.  She has history of question IBD, has had colonoscopy and EGD as well there is concern for gastritis on EGD per her own report.  History of Graves'              Glasgow Coma Scale Score: 15                                  Patient History   Past Medical History:   Diagnosis Date   . Acute Crohn's disease (CMS/HCC)    . Bradycardia    . Graves disease    . IBS (irritable bowel syndrome)    . Irritable bowel syndrome    . Migraines      Past Surgical History:   Procedure Laterality Date   . MOUTH SURGERY       No family history on file.  Social History     Tobacco Use   . Smoking status: Former     Types: Cigarettes   . Smokeless tobacco: Never   Vaping Use   . Vaping Use: Never used   Substance Use Topics   . Alcohol use: Not Currently   . Drug use: Yes     Types: Marijuana         Physical Exam   ED Triage Vitals [07/31/22 0114]   Temp Pulse Resp BP   36.8 C (98.2 F) 88 18 (!) 140/89      SpO2 Temp Source Heart Rate Source Patient Position   98 % Oral Monitor Sitting      BP Location FiO2 (%)     Left arm --       Physical Exam  Vitals and nursing note reviewed.   Constitutional:       General: She is not in acute  distress.     Appearance: She is well-developed. She is not ill-appearing, toxic-appearing or diaphoretic.   HENT:      Head: Normocephalic and atraumatic.      Nose: Nose normal.      Mouth/Throat:      Mouth: Mucous membranes are moist.   Eyes:      Extraocular Movements: Extraocular movements intact.      Conjunctiva/sclera: Conjunctivae normal.      Pupils: Pupils are equal, round, and reactive to light.   Cardiovascular:      Rate and Rhythm: Normal rate and regular rhythm.      Pulses: Normal pulses.      Heart sounds: Normal heart sounds. No murmur heard.  No gallop.   Pulmonary:      Effort: Pulmonary effort is normal. No respiratory distress.      Breath sounds: Normal breath sounds. No wheezing, rhonchi or rales.   Abdominal:      General: There is no distension.      Palpations: Abdomen is soft.      Tenderness: There is abdominal tenderness in the epigastric area. There is no right CVA tenderness, left CVA tenderness, guarding or rebound. Negative signs include Murphy's sign and McBurney's sign.   Musculoskeletal:         General: No swelling or deformity. Normal range of motion.      Cervical back: Normal range of motion and neck supple.      Right lower leg: No edema.      Left lower leg: No edema.   Skin:     General: Skin is warm and dry.   Neurological:      General: No focal deficit present.      Mental Status: She is alert.      Sensory: No sensory deficit.      Motor: No weakness.   Psychiatric:         Behavior: Behavior normal.       CT ABDOMEN PELVIS W CONTRAST   Final Result      1.  No acute abdominopelvic pathology.   2.  Small amount of free fluid in the pelvis is likely physiologic.      Tiffany Butts, MD 07/31/2022 6:08 AM          Labs Reviewed   URINALYSIS REFLEX TO CULTURE - Abnormal       Result Value    Color, Ur Yellow      Clarity, Ur Cloudy (*)     Specific Gravity, Ur 1.025      pH, Ur 5.5      Protein,Ur 30 (*)     Glucose,Ur Negative      Ketones, Ur Trace (*)      Bilirubin, Ur Small (*)     Blood, Ur Negative      Urobilinogen, Ur 0.2      Nitrite, Ur Negative      Leukocyte Esterase, Ur Trace (*)     RBC, Ur 1      WBC, Ur 3      Epithelial Cells, UR 4      Bacteria, Ur Trace      Casts, Ur 0      Calcium Oxalate Crystal, Ur Present (*)    CBC WITH DIFFERENTIAL - Abnormal    WBC 9.7      RBC 5.02      Hemoglobin 13.6      Hematocrit 42.2      MCV 84.1      MCH 27.1      MCHC 32.2      RDW-CV 13.2      RDW-SD 40.0      Platelets 332      MPV 8.7 (*)     Neutrophil % 50.1      Lymphocyte % 41.7      Monocytes % 6.1      Eosinophils % 1.0      Basophils % 0.7      Immature Granulocytes % 0.4      NRBC % 0.0      Neutrophils Absolute 4.83      Lymphocytes Absolute 4.02 (*)     Monocytes Absolute 0.59  Eosinophils Absolute 0.10      Basophils Absolute 0.07      Immature Granulocytes Absolute 0.04      NRBC Absolute 0.00     MAGNESIUM - Normal    Magnesium 1.9     LIPASE - Normal    Lipase 29     TROPONIN I, HIGH SENSITIVITY - Normal    Troponin I, High Sensitivity <6      Narrative:     Clinical correlation, HEART Score and shared decision making must be taken into account.                   For Chest Pain >3 Hours    Rule-Out Criteria              Single Value     0hr/1hr Delta Value  Female  <10ng/L*          <54ng/L AND delta <15ng/L  Female    <10ng/L*          <79ng/L AND delta <15ng/L    Cannot Rule-Out                            0hr/1hr Delta Value  Female    <54ng/L AND delta >15ng/L    >/= 54 AND delta </=15ng/L  Female      <79ng/L AND delta >15ng/L    >/= 79 AND delta </=15ng/L    Rule-In Criteria               Single Value   0hr/1hr Delta Value                 Female   >115ng/L       >/=54ng/L AND delta >/=15ng/L         Female     >115ng/L       >/=79ng/L AND delta >/=15ng/L           *Note: for Chest pain <3 hours 0hr/1hr is warranted for evaluation.     COMPREHENSIVE METABOLIC PANEL    Sodium 140      Potassium 3.9      Chloride 109      CO2 (Bicarbonate) 26       Anion Gap 5      BUN 10      Creatinine 0.79      eGFRcr 98      Glucose 103      Fasting? No      Calcium 8.8      AST 10      ALT 19      Alkaline phosphatase 71      Protein, total 7.4      Albumin 3.4      Bilirubin, total 0.2     CBC W/DIFF    Narrative:     The following orders were created for panel order CBC and differential.  Procedure                               Abnormality         Status                     ---------                               -----------         ------  CBC w/ Differential[[id number]]          Abnormal            Final result                 Please view results for these tests on the individual orders.   HCG, SERUM, QUALITATIVE    hCG Qualitative Serum Negative         ED Course & MDM   Diagnoses as of 07/31/22 0854   Abdominal pain, epigastric       Medical Decision Making  Abdominal pain, epigastric: complicated acute illness or injury  Amount and/or Complexity of Data Reviewed  External Data Reviewed: labs, radiology and notes.  Labs: ordered. Decision-making details documented in ED Course.  Radiology:  Decision-making details documented in ED Course.  ECG/medicine tests: ordered and independent interpretation performed. Decision-making details documented in ED Course.      Risk  OTC drugs.        Overall, well-appearing with normal vital signs on my evaluation, CT imaging was ordered prior to my arrival to the ER and this was completed prior to me picking up the patient.  Her labs are very reassuring and this is reassuring given her normal lipase for not being pancreatitis, and normal LFTs low suspicion for hepatobiliary process especially with a negative Murphy sign on clinical examination I have low suspicion for choledocholithiasis, cholelithiasis, biliary colic as a part of her presentation.  Her symptoms are consistent with gastritis overall.  Also question given there is some help with massage or could be a musculoskeletal aspect of pain as well.   Consider a hyperemesis cannabinoid cyclical vomiting process as well given her restart of THC in the last 24 hours but there are also other reasons including gastritis and musculoskeletal issues which may be the cause of symptoms.  I discussed medical uncertainty with her at bedside.  I did send a troponin which is normal as possible atypical presentation of ACS and EKG which is unremarkable which is all very reassuring is not a cardiopulmonary process or ACS and I have very low clinical suspicion for pneumothorax, pneumonia, CHF, dissection or esophageal rupture based off clinical exam and history so no further work-up indicated for those  unlikely diagnosis.  Pain not lower abdomen so low suspicion for GU process, pyelonephritis, kidney stone, UTI or diverticulitis or appendicitis clinically.    EKG as interpreted independently by myself shows normal sinus rhythm, rate 70, normal axis, normal intervals, no ST elevation or depression, T wave flattening in inferior leads.  No significant changes with question of mild T wave flattening but nonspecific and similar otherwise in comparison to July 10, 2022.    As noted, there is trace leukocytes without urinary symptoms low suspicion for UTI or pyelonephritis, she has normal electrolytes, normal kidney function, no anemia and at this time she will be stable for discharge with symptomatic control.    Symptoms seem to be well controlled after Pepcid, Toradol, droperidol for nausea and fluid bolus.  She did appear mildly dry clinically prior to the bolus as well but now with continued normal vital signs improved symptoms she will be stable for discharge and patient is comfortable with plan.    Plan:  - Please take medications as prescribed by your GI physician as recommended.  - Continue to stay well-hydrated drinking plenty of fluids.  - There is medical uncertainty of the cause of symptoms but are high suspicion is gastritis as cause of  symptoms and may be aspect of  muscle pain associated with the cramping symptoms that improved with massage.  - She can also consider taking Tylenol 1000 mg as often as every 6 hours as needed for pain to see if this helps improve symptoms.  Do not take more than 4000 mg of Tylenol in 1 day.  Tylenol is also known as acetaminophen or paracetamol so please read all over-the-counter medications as this is sometimes an over-the-counter medications and small doses.  - Follow-up with the GI physician team as planned.  - Return to the Emergency Department for any worsening concerning symptoms including worsening abdominal pain associate with uncontrolled vomiting, new fevers, new chest pain or shortness of breath or any other concerning or worsening symptoms.    Above plan discussed with patient and wife at bedside and they are amenable to discharge plan and follow-up and all questions and concerns addressed.          Loel Lofty, MD  07/31/22 [phone]

## 2022-07-31 NOTE — Discharge Instructions (Signed)
Plan:  - Please take medications as prescribed by your GI physician as recommended.  - Continue to stay well-hydrated drinking plenty of fluids.  - There is medical uncertainty of the cause of symptoms but are high suspicion is gastritis as cause of symptoms and may be aspect of muscle pain associated with the cramping symptoms that improved with massage.  - She can also consider taking Tylenol 1000 mg as often as every 6 hours as needed for pain to see if this helps improve symptoms.  Do not take more than 4000 mg of Tylenol in 1 day.  Tylenol is also known as acetaminophen or paracetamol so please read all over-the-counter medications as this is sometimes an over-the-counter medications and small doses.  - Follow-up with the GI physician team as planned.  - Return to the Emergency Department for any worsening concerning symptoms including worsening abdominal pain associate with uncontrolled vomiting, new fevers, new chest pain or shortness of breath or any other concerning or worsening symptoms.

## 2022-07-31 NOTE — ED Triage Notes (Addendum)
Patient reports 24hr hx epigastric pain that sometimes gets better with massage, reports occasional SOB d/t pain and chest discomfort, pain worse after eating, pain radiates to shoulders   Reports on/off for past 6 weeks, worse past 24hrs

## 2022-07-31 NOTE — Other (Signed)
Patient Education   Table of Contents     Abdominal Pain, Adult     To view videos and all your education online visit,   [email or web address]   or scan this QR code with your smartphone.   Access to this content will expire in one year.                    Abdominal Pain, Adult     Pain in the abdomen (abdominal pain) can be caused by many things. Often, abdominal pain is not serious and it gets better with no treatment or by being treated at home. However, sometimes abdominal pain is serious.   Your health care provider will ask questions about your medical history and do a physical exam to try to determine the cause of your abdominal pain.   Follow these instructions at home:   Medicines     Take over-the-counter and prescription medicines only as told by your health care provider.    Do not  take a laxative unless told by your health care provider.     General instructions        Watch your condition for any changes.     Drink enough fluid to keep your urine pale yellow.     Keep all follow-up visits as told by your health care provider. This is important.       Contact a health care provider if:       Your abdominal pain changes or gets worse.     You are not hungry or you lose weight without trying.     You are constipated or have diarrhea for more than 2?3 days.     You have pain when you urinate or have a bowel movement.     Your abdominal pain wakes you up at night.     Your pain gets worse with meals, after eating, or with certain foods.     You are vomiting and cannot keep anything down.     You have a fever.     You have blood in your urine.     Get help right away if:       Your pain does not go away as soon as your health care provider told you to expect.     You cannot stop vomiting.     Your pain is only in areas of the abdomen, such as the right side or the left lower portion of the abdomen. Pain on the right side could be caused by appendicitis.     You have bloody or black stools, or  stools that look like tar.     You have severe pain, cramping, or bloating in your abdomen.     You have signs of dehydration, such as:     Dark urine, very little urine, or no urine.     Cracked lips.     Dry mouth.     Sunken eyes.     Sleepiness.     Weakness.     You have trouble breathing or chest pain.     Summary       Often, abdominal pain is not serious and it gets better with no treatment or by being treated at home. However, sometimes abdominal pain is serious.     Watch your condition for any changes.     Take over-the-counter and prescription medicines only as told by your health care provider.     Contact a  health care provider if your abdominal pain changes or gets worse.     Get help right away if you have severe pain, cramping, or bloating in your abdomen.     This information is not intended to replace advice given to you by your health care provider. Make sure you discuss any questions you have with your health care provider.     Document Released: 08/19/2005 Document Revised: 12/29/2019 Document Reviewed: 03/20/2019     Elsevier Patient Education ? 2023 [employer]

## 2022-08-07 ENCOUNTER — Ambulatory Visit: Admit: 2022-08-07 | Discharge: 2022-08-07 | Payer: MEDICAID | Primary: Internal Medicine

## 2022-08-07 ENCOUNTER — Encounter: Payer: MEDICAID | Primary: Internal Medicine

## 2022-08-07 DIAGNOSIS — K5000 Crohn's disease of small intestine without complications: Principal | ICD-10-CM

## 2022-08-07 DIAGNOSIS — K51919 Ulcerative colitis, unspecified with unspecified complications: Secondary | ICD-10-CM

## 2022-08-07 MED ORDER — inFLIXimab (Remicade) 400 mg in sodium chloride 0.9 % 250 mL IVPB
100 | Freq: Once | INTRAVENOUS | Status: AC
Start: 2022-08-07 — End: 2022-08-07
  Administered 2022-08-07: 14:00:00 via INTRAVENOUS

## 2022-08-07 MED ORDER — sodium chloride 0.9 % flush 10 mL
Freq: Once | INTRAMUSCULAR | Status: DC
Start: 2022-08-07 — End: 2022-08-07

## 2022-08-07 MED ORDER — sodium chloride 0.9 % flush 10 mL
Freq: Once | INTRAMUSCULAR | Status: DC | PRN
Start: 2022-08-07 — End: 2022-08-07

## 2022-08-07 MED FILL — IVPB BUILDER: 100 mg | INTRAVENOUS | Qty: 40

## 2022-08-07 NOTE — Progress Notes (Signed)
Tiffany Leach came to the clinic today for remicade 400mg  iv over 2 hours.  Tolerated well.    She has her next apt.

## 2022-08-09 LAB — UNMAPPED LAB RESULTS

## 2022-09-18 ENCOUNTER — Encounter

## 2022-09-18 ENCOUNTER — Encounter: Payer: MEDICAID | Primary: Internal Medicine

## 2022-10-02 ENCOUNTER — Ambulatory Visit: Admit: 2022-10-02 | Discharge: 2022-10-02 | Payer: MEDICAID | Primary: Internal Medicine

## 2022-10-02 DIAGNOSIS — K5000 Crohn's disease of small intestine without complications: Principal | ICD-10-CM

## 2022-10-02 DIAGNOSIS — K51919 Ulcerative colitis, unspecified with unspecified complications: Secondary | ICD-10-CM

## 2022-10-02 MED ORDER — sodium chloride 0.9 % flush 10 mL
Freq: Once | INTRAMUSCULAR | Status: AC
Start: 2022-10-02 — End: 2022-10-02
  Administered 2022-10-02: 14:00:00 10 mL via INTRAVENOUS

## 2022-10-02 MED ORDER — sodium chloride 0.9 % flush 10 mL
Freq: Once | INTRAMUSCULAR | Status: DC | PRN
Start: 2022-10-02 — End: 2022-10-02
  Administered 2022-10-02: 17:00:00 10 mL via INTRAVENOUS

## 2022-10-02 MED ORDER — inFLIXimab (Remicade) 400 mg in sodium chloride 0.9 % 250 mL IVPB
100 | Freq: Once | INTRAVENOUS | Status: AC
Start: 2022-10-02 — End: 2022-10-02
  Administered 2022-10-02: 15:00:00 400 mg via INTRAVENOUS

## 2022-10-02 MED FILL — IVPB BUILDER: 400.0000 400.0000 mg | Qty: 40

## 2022-10-02 NOTE — Progress Notes (Addendum)
[facility]  ivinfusionto run per protocol.    Hep B- 12/8/2022andTB-10/30/2021   (see lab results scanned in media)    No labs ordered to be drawn.   No premedications ordered.    Peripheral IV inserted, flushed with NS, blood return noted and tolerated well.    IV infusionadministered, tolerated well, no adverse reactions noted.    At discharge,Peripheral IV flushedwith normal saline, PIV discontinued and covered with a sterile gauze.    Patient aware of next appointment.    [facility] ambulating with upright steady gait.

## 2022-11-13 ENCOUNTER — Encounter: Payer: MEDICAID | Primary: Internal Medicine

## 2022-11-27 ENCOUNTER — Ambulatory Visit: Admit: 2022-11-27 | Discharge: 2022-11-27 | Payer: MEDICAID | Primary: Internal Medicine

## 2022-11-27 DIAGNOSIS — K51919 Ulcerative colitis, unspecified with unspecified complications: Secondary | ICD-10-CM

## 2022-11-27 LAB — HEPATITIS B SURFACE ANTIGEN: Hepatitis B surface antigen: NONREACTIVE

## 2022-11-27 MED ORDER — sodium chloride 0.9 % flush 10 mL
Freq: Once | INTRAMUSCULAR | Status: DC | PRN
Start: 2022-11-27 — End: 2022-11-27

## 2022-11-27 MED ORDER — inFLIXimab (Remicade) 400 mg in sodium chloride 0.9 % 250 mL IVPB
100 | Freq: Once | INTRAVENOUS | Status: AC
Start: 2022-11-27 — End: 2022-11-27
  Administered 2022-11-27: 15:00:00 via INTRAVENOUS

## 2022-11-27 MED ORDER — sodium chloride 0.9 % flush 10 mL
Freq: Once | INTRAMUSCULAR | Status: DC
Start: 2022-11-27 — End: 2022-11-27

## 2022-11-27 MED FILL — IVPB BUILDER: Qty: 40

## 2022-11-27 NOTE — Progress Notes (Signed)
Patient presents to [facility] for Remicade 400 mg IV infusion, last dose per MD. 22G angiocath placed in R FA, brisk blood return noted, flushed with NS without resistance, and tolerated well. Labs drawn per MD orders. Medication administered per MD orders, tolerated well, no adverse reactions noted, PIV flushed with NS following completion of medication administration per protocol. At discharge, PIV removed and DSD applied. AVS given. Patient ambulated off unit independently with upright, steady gait.

## 2022-11-27 NOTE — Addendum Note (Signed)
Addended by: Loyce Dys on: 11/27/2022 12:33 PM     Modules accepted: Orders

## 2022-11-29 LAB — QUANTIFERON-TB GOLD PLUS
QuantiFERON Mitogen Value: >10 [IU]/mL
QuantiFERON Nil Value: 0 [IU]/mL
QuantiFERON TB1 Ag Value: 0 [IU]/mL
QuantiFERON TB2 Ag Value: 0 [IU]/mL

## 2022-11-29 LAB — QUANTIFERON TB GOLD, 1T: QuantiFERON-TB Gold Plus: NEGATIVE

## 2023-01-08 ENCOUNTER — Encounter: Payer: MEDICAID | Primary: Internal Medicine

## 2023-01-22 ENCOUNTER — Encounter: Payer: MEDICAID | Primary: Internal Medicine

## 2023-03-05 ENCOUNTER — Encounter: Payer: MEDICAID | Primary: Internal Medicine

## 2023-12-05 LAB — BMP (EXT)
Anion Gap (EXT): 12 mmol/L (ref 3–17)
BUN (EXT): 17 mg/dL (ref 8–25)
CO2 (EXT): 24 mmol/L (ref 23–32)
CalciumCalcium (EXT): 9.4 mg/dL (ref 8.5–10.5)
Chloride (EXT): 103 mmol/L (ref 98–108)
Creatinine (EXT): 0.69 mg/dL (ref 0.50–1.00)
GFR Estimated (Calc) (EXT): 112 mL/min/{1.73_m2} (ref >59)
Glucose (EXT): 109 mg/dL (ref 70–110)
Potassium (EXT): 4.3 mmol/L (ref 3.4–5.0)
Sodium (EXT): 139 mmol/L (ref 135–145)

## 2024-03-19 ENCOUNTER — Inpatient Hospital Stay: Admit: 2024-03-19 | Discharge: 2024-03-19 | Disposition: A | Discharge status: Home / Self Care | Payer: MEDICAID

## 2024-03-19 DIAGNOSIS — S025XXA Fracture of tooth (traumatic), initial encounter for closed fracture: Secondary | ICD-10-CM

## 2024-03-19 DIAGNOSIS — K0889 Other specified disorders of teeth and supporting structures: Secondary | ICD-10-CM

## 2024-03-19 MED ORDER — ketorolac (Toradol) injection 30 mg
30 | Freq: Once | INTRAMUSCULAR | Status: AC
Start: 2024-03-19 — End: 2024-03-19
  Administered 2024-03-19: 11:00:00 30 mg via INTRAMUSCULAR

## 2024-03-19 MED ORDER — oxyCODONE (Roxicodone) 5 mg immediate release tablet
5 | ORAL_TABLET | Freq: Three times a day (TID) | ORAL | 0 refills | 8.00000 days | Status: AC | PRN
Start: 2024-03-19 — End: 2024-03-22

## 2024-03-19 MED ORDER — benzocaine (Hurricaine) 20 % gel gel
20 | Freq: Three times a day (TID) | 0 refills | 9.00000 days | Status: AC | PRN
Start: 2024-03-19 — End: ?

## 2024-03-19 MED FILL — KETOROLAC 30 MG/ML (1 ML) INJECTION SOLUTION: 30 30 mg/mL (1 mL) | INTRAMUSCULAR | Qty: 1 | Fill #0

## 2024-03-19 NOTE — ED Triage Notes (Signed)
 Presents with dental pain. Reports broken tooth (#30) right lower tooth. Finished antibx 2 weeks ago. Needs extraction.

## 2024-03-19 NOTE — ED Provider Notes (Signed)
 [facility]  [address]  [place] [address]        HPI   Chief Complaint   Patient presents with    Dental Pain        43 year old female with history of Crohn's and ulcerative colitis presents with dental pain and fractured tooth #30.  Patient reports the tooth fractured approximately 3 weeks ago, she had x-ray imaging and was referred to a dentist who performs extractions.  She was prescribed clindamycin x 1 week and finished the course 9 days ago.  Reports is having a delay in the extraction due to an insurance issue.  She is actively working on resolving the issue and anticipates being able to make the appointment for the extraction within the next few days.  Patient denies any fever, chills, swelling to the face of the neck.  She reports sensation that the tooth has shifted and is irritating the gums.    Reports she has a history of Crohn's and was on Remicade, stopped Remicade approximately 1 year ago and has not had any flares of her Crohn's disease        Patient History   Medical History[1]  Surgical History[2]  Family History[3]  Social History     Tobacco Use    Smoking status: Former     Types: Cigarettes    Smokeless tobacco: Never   Vaping Use    Vaping status: Never Used   Substance Use Topics    Alcohol use: Not Currently    Drug use: Not Currently       Review of Systems   Review of Systems   All other systems reviewed and are negative.      Physical Exam   ED Triage Vitals [03/19/24 0702]   Temp Pulse Resp BP   36.8 C (98.2 F) 78 18 116/79      SpO2 Temp Source Heart Rate Source Patient Position   98 % Oral Monitor Sitting      BP Location FiO2 (%)     Right arm --       Physical Exam  Vitals and nursing note reviewed.   Constitutional:       General: She is not in acute distress.     Appearance: Normal appearance. She is not ill-appearing, toxic-appearing or diaphoretic.   HENT:      Nose: Nose normal.      Mouth/Throat:      Mouth: Mucous membranes are  moist.      Pharynx: No oropharyngeal exudate or posterior oropharyngeal erythema.      Comments: Visible dental fracture to number 30  No swelling to the gumline, no facial and neck swelling.  Negative trismus, handles oral secretions.  Has full range of motion of the neck.  No erythema to the face  Eyes:      Conjunctiva/sclera: Conjunctivae normal.   Cardiovascular:      Rate and Rhythm: Normal rate and regular rhythm.   Pulmonary:      Effort: Pulmonary effort is normal.      Breath sounds: Normal breath sounds.   Musculoskeletal:      Cervical back: Normal range of motion and neck supple. No rigidity or tenderness.   Lymphadenopathy:      Cervical: No cervical adenopathy.   Skin:     General: Skin is warm and dry.      Capillary Refill: Capillary refill takes less than 2 seconds.  Findings: No rash.   Neurological:      Mental Status: She is alert.       No orders to display       Labs Reviewed - No data to display    Glasgow Coma Scale Score: 15      ED Course & MDM   Diagnoses as of 03/19/24 0840   Pain, dental   Closed fracture of tooth, initial encounter       Medical Decision Making  43 year old female presents with dental fracture and discomfort.  The patient has no signs of a dental abscess although does have a visible dental fracture  Patient has risk factor of medical history involving Crohn's and ulcerative colitis, therefore NSAIDs should be avoided and patient recently completed a course of clindamycin.  She has no signs of infection currently no facial or neck swelling, no swelling along the gumline and no history of fever and is afebrile.  No indication to repeat the clindamycin given patient's allergies to penicillin as will increase her risk for C. difficile.  Will prescribe pain management with oxycodone, benzocaine and recommend 2 extra Tylenol 2-3 times a day for pain.  Recommend avoiding NSAIDs given history of Crohn's colitis and upcoming extraction.  Provided the patient with oral  surgery contact information  Patient is to return to the ER if she develops fever, chills, facial and neck swelling of increased pain    Problems Addressed:  Closed fracture of tooth, initial encounter: complicated acute illness or injury  Pain, dental: complicated acute illness or injury    Risk  OTC drugs.  Prescription drug management.                              [1]   Past Medical History:  Diagnosis Date    Acute Crohn's disease (Multi-HCC)     Bradycardia     Graves disease      IBS (irritable bowel syndrome)     Irritable bowel syndrome     Migraines     [2]   Past Surgical History:  Procedure Laterality Date    MOUTH SURGERY     [3] No family history on file.       Samuella Crocker, NP  03/19/24 [phone]

## 2024-03-19 NOTE — Discharge Instructions (Signed)
 Take the oxycodone for moderate pain, do not drive and take this medication as it is a narcotic.  This medication can cause constipation, take an over-the-counter stool softener when taking  Take 2 extra Tylenol 2-3 times a day for pain and avoid NSAIDs for 24 to 48 hours prior to extraction  Call oral surgery or your dentist to schedule the extraction within the next few days as you are having increased pain to the tooth which is visibly fractured.  No indication for antibiotics at this time as you have completed a course of clindamycin and have no fever and no signs of infection on exam  Return to emergency room if you develop facial swelling, neck swelling, fever develops or you have concerns

## 2024-03-19 NOTE — Other (Signed)
 Patient Education  Table of Contents   Tooth Injuries: What to Know   Dental Pain: What It Means    To view videos and all your education online visit,  https://pe.elsevier.com/8XMebGit  or scan this QR code with your smartphone.  Access to this content will expire in one year.  Tooth Injuries: What to Know  Tooth injuries include:   Cracked or broken teeth.   Teeth that have moved out of place.   Teeth that have been knocked out of your mouth.  Tooth injuries need to be treated quickly to save the tooth. Sometimes, it's not possible to save a tooth after an injury.  What are the causes?  Tooth injuries may be caused by any injury that is strong enough to:   Chip a tooth.   Break a tooth.   Move a tooth.   Knock out a tooth.  What increases the risk?  You are more likely to have a tooth injury if you:   Play contact sports, such as football or boxing, without wearing a mouth guard.   Do activities with a higher risk of injury if you fall, such as riding a bike.   Having a medical problem that increases the risk of falling or fainting.   Don't brush your teeth well. This increases the risk of gum disease, tooth decay, and bone infections.   Have less bone to support the tooth. This can happen because of diseases or infections of the teeth, gums, or jaws.   Eat or chew on very hard objects.   Clench or grind your teeth.  What are the signs or symptoms?  Signs of tooth injuries include a tooth being:   Forced out of position, such as toward the tongue or out towards the lips.   Completely knocked out of the tooth socket.   Cracked or broken.  Other symptoms may include:   New or increased bleeding around the injured tooth.   Changes in the color of the tooth.   Increased tooth sensitivity or tenderness, especially when chewing.   A loose tooth.   Swelling or bruising of the lips or gums around or near the injured tooth.  How is this diagnosed?  This condition may be diagnosed based on:   Your medical and dental  history.   Details about how the injury happened.   Exams of your mouth and teeth.   Dental x-rays.  How is this treated?  Treatment depends on the type of injury and how bad it is. Some treatments should be done quickly to save your tooth.  Possible treatments include:   Putting in a tooth filling or building-up of a tooth with filling material.   Putting a hard, protective cover over the crown of the tooth.   Treating the nerve inside the tooth.   Putting the tooth back in place, if it moved.   Putting a knocked-out tooth back into its socket, if possible.   Using a splint to support a tooth that is out of place, knocked out, or loose.   Taking out the whole tooth or taking out parts of a tooth if it's broken.   Taking medicine to treat pain and prevent infection.  Follow these instructions at home:  Eating and drinking   Eat and drink as told.   You may need to stay away from some types of foods or only eat soft foods as told by your provider.   If the tooth was put back  in the socket. do not use the tooth to bite food.  Activity   Avoid activities that have a high risk for face or tooth injury.   Always wear recommended protective gear when playing contact sports, such as a face mask and mouth guard.  Caring for your teeth     Do not eat or chew on very hard objects like ice cubes, pens, pencils, hard candy, and popcorn kernels.   Tell your dentist if you grind your teeth while you sleep. Wear a nightguard if you clench or grind your teeth.   Brush your teeth with a soft toothbrush after every meal. Make sure to brush gently on and around the re-planted tooth.  General instructions   Check the area around your injured tooth every day for signs of infection. Check for:  ? More redness, swelling, or pain.  ? More fluid or blood.  ? Warmth.  ? Pus or a bad smell.   Take your medicines only as told.  Contact a dental care provider if:   You have a splint, and it becomes loose, broken, or the tooth falls out.   You  have any signs of infection.   You have bleeding that doesn't stop.   You have pain that doesn't get better with medicine.   Your tooth becomes loose.   You have a fever.  Get help right away if:  This information is not intended to replace advice given to you by your health care provider. Make sure you discuss any questions you have with your health care provider.  Document Released: 2004-08-06 Document Updated: 2023-07-11 Document Reviewed: 2023-07-11  Elsevier Patient Education ? 2025 [employer]  Dental Pain: What It Means    Dental pain is often a sign that something is wrong with your teeth or gums. You can also have pain after a dental treatment. If you have dental pain, it's important to contact your dental care provider, especially if you don't know what's causing the pain.   Dental pain may hurt a lot or a little. It can be caused by many things, including:   Tooth decay. This is also called cavities or caries.   Infection.   An abscess. This is when the inner part of the tooth is filled with pus.   An injury or a crack in the tooth.   Gum disease or gums that move back and expose the root of a tooth.   Abnormal grinding or clenching of teeth.   Not taking good care of your teeth.  Sometimes the cause of pain isn't known.  You may have pain all the time, or it may come and go. It may happen only when you're:   Chewing.   Exposed to hot or cold temperatures.   Eating or drinking foods or drinks with a lot of sugar in them, such as soda or candy.  Follow these instructions at home:  Medicines   Take your medicines only as told.   If you were given antibiotics, take them as told. Do not stop taking them even if you start to feel better.  Eating and drinking  Avoid foods or drinks that cause you pain. These may include:   Very hot or very cold foods or drinks.   Sweet or sugary foods or drinks.  Managing pain and swelling     Use ice or an ice pack on the painful area of your face as told.  ? Put ice in a  plastic  bag.  ? Place a towel between your skin and the ice.   ? Leave the ice on for 20 minutes, 2?3 times a day.  ? Remove the ice if your skin turns bright red. This is very important. If you cannot feel pain, heat, or cold, you have a greater risk of damage to the area.   If your skin turns red, take off the ice right away to prevent skin damage. The risk of damage is higher if you can't feel pain, heat, or cold.  Brushing and flossing   Brush your teeth twice a day using a fluoride toothpaste.   Use a toothpaste made for sensitive teeth as told by your dental care provider.   Always use a soft toothbrush. This will help prevent irritation of your gums.   Floss your teeth at least once a day.  General instructions   Do not apply heat to the outside of your face.   To cleanse your mouth and help with any irritation and swelling in the painful area, you can try rinsing with salt water.  ? Swish and gargle with salt water and then spit it out. To make salt water, add a half to a whole spoonful of salt to a glass of warm water. Mix well.  ? You can repeat this every 2?3 hours for pain.  Contact a dental care provider if:   You have dental pain and you don't know why.   Your pain isn't controlled with medicines.   Your symptoms get worse.   You have new symptoms.  Get help right away if:   You can't open your mouth.   You're having trouble breathing or swallowing.   You have a fever.   Your face, neck, or jaw is swollen.  These symptoms may be an emergency. Call 911 right away.   Do not wait to see if the symptoms will go away.     Do not drive yourself to the hospital.  This information is not intended to replace advice given to you by your health care provider. Make sure you discuss any questions you have with your health care provider.  Document Released: 2005-11-09 Document Updated: 2023-09-21 Document Reviewed: 2023-04-08  Elsevier Patient Education ? 2025 [employer].
